# Patient Record
Sex: Female | Born: 1935 | Race: White | Hispanic: No | Smoking: Former smoker
Health system: Southern US, Community
[De-identification: ages and names within clinical notes are randomized; demographics above are authoritative.]

## PROBLEM LIST (undated history)

## (undated) DIAGNOSIS — I739 Peripheral vascular disease, unspecified: Secondary | ICD-10-CM

## (undated) DIAGNOSIS — I4891 Unspecified atrial fibrillation: Secondary | ICD-10-CM

## (undated) DIAGNOSIS — Z860100 Personal history of colon polyps, unspecified: Secondary | ICD-10-CM

## (undated) DIAGNOSIS — I10 Essential (primary) hypertension: Secondary | ICD-10-CM

## (undated) DIAGNOSIS — N289 Disorder of kidney and ureter, unspecified: Secondary | ICD-10-CM

## (undated) DIAGNOSIS — F419 Anxiety disorder, unspecified: Secondary | ICD-10-CM

## (undated) DIAGNOSIS — K219 Gastro-esophageal reflux disease without esophagitis: Secondary | ICD-10-CM

## (undated) DIAGNOSIS — R569 Unspecified convulsions: Secondary | ICD-10-CM

## (undated) DIAGNOSIS — I509 Heart failure, unspecified: Secondary | ICD-10-CM

## (undated) DIAGNOSIS — J449 Chronic obstructive pulmonary disease, unspecified: Secondary | ICD-10-CM

## (undated) HISTORY — DX: Peripheral vascular disease, unspecified: I73.9

## (undated) HISTORY — DX: Personal history of colonic polyps: Z86.010

## (undated) HISTORY — DX: Unspecified atrial fibrillation: I48.91

## (undated) HISTORY — PX: CATARACT EXTRACTION: SUR2

## (undated) HISTORY — PX: ABDOMINAL HYSTERECTOMY: SHX81

## (undated) HISTORY — DX: Disorder of kidney and ureter, unspecified: N28.9

## (undated) HISTORY — PX: APPENDECTOMY: SHX54

## (undated) HISTORY — DX: Unspecified convulsions: R56.9

## (undated) HISTORY — DX: Essential (primary) hypertension: I10

## (undated) HISTORY — PX: RETINAL DETACHMENT SURGERY: SHX105

## (undated) HISTORY — PX: LUMBAR DISC SURGERY: SHX700

## (undated) HISTORY — DX: Heart failure, unspecified: I50.9

## (undated) HISTORY — DX: Personal history of colon polyps, unspecified: Z86.0100

## (undated) HISTORY — DX: Chronic obstructive pulmonary disease, unspecified: J44.9

## (undated) HISTORY — DX: Anxiety disorder, unspecified: F41.9

## (undated) HISTORY — DX: Gastro-esophageal reflux disease without esophagitis: K21.9

---

## 2002-03-16 DIAGNOSIS — D126 Benign neoplasm of colon, unspecified: Secondary | ICD-10-CM | POA: Insufficient documentation

## 2003-06-07 ENCOUNTER — Other Ambulatory Visit: Payer: Self-pay

## 2004-05-14 LAB — HM MAMMOGRAPHY

## 2005-01-11 ENCOUNTER — Ambulatory Visit: Payer: Self-pay | Admitting: Endocrinology

## 2005-04-20 ENCOUNTER — Inpatient Hospital Stay: Payer: Self-pay | Admitting: Endocrinology

## 2005-04-20 ENCOUNTER — Other Ambulatory Visit: Payer: Self-pay

## 2005-04-24 ENCOUNTER — Other Ambulatory Visit: Payer: Self-pay

## 2005-04-27 ENCOUNTER — Other Ambulatory Visit: Payer: Self-pay

## 2005-05-02 ENCOUNTER — Inpatient Hospital Stay: Payer: Self-pay | Admitting: Endocrinology

## 2005-05-02 ENCOUNTER — Other Ambulatory Visit: Payer: Self-pay

## 2005-05-03 ENCOUNTER — Other Ambulatory Visit: Payer: Self-pay

## 2005-05-07 ENCOUNTER — Other Ambulatory Visit: Payer: Self-pay

## 2005-09-13 ENCOUNTER — Ambulatory Visit: Payer: Self-pay | Admitting: Family Medicine

## 2005-09-20 ENCOUNTER — Ambulatory Visit: Payer: Self-pay | Admitting: Internal Medicine

## 2005-09-29 ENCOUNTER — Ambulatory Visit: Payer: Self-pay | Admitting: Family Medicine

## 2005-10-13 ENCOUNTER — Ambulatory Visit: Payer: Self-pay | Admitting: Internal Medicine

## 2005-10-19 ENCOUNTER — Ambulatory Visit: Payer: Self-pay | Admitting: Internal Medicine

## 2005-10-27 ENCOUNTER — Ambulatory Visit: Payer: Self-pay | Admitting: Family Medicine

## 2005-11-09 ENCOUNTER — Ambulatory Visit: Payer: Self-pay | Admitting: Internal Medicine

## 2005-11-23 ENCOUNTER — Ambulatory Visit: Payer: Self-pay | Admitting: Internal Medicine

## 2005-12-13 ENCOUNTER — Ambulatory Visit: Payer: Self-pay | Admitting: Family Medicine

## 2005-12-21 ENCOUNTER — Ambulatory Visit: Payer: Self-pay | Admitting: Internal Medicine

## 2006-01-19 ENCOUNTER — Ambulatory Visit: Payer: Self-pay | Admitting: Family Medicine

## 2006-01-26 ENCOUNTER — Ambulatory Visit: Payer: Self-pay | Admitting: Internal Medicine

## 2006-02-09 ENCOUNTER — Ambulatory Visit: Payer: Self-pay | Admitting: Internal Medicine

## 2006-02-12 ENCOUNTER — Ambulatory Visit: Payer: Self-pay | Admitting: Family Medicine

## 2006-02-19 ENCOUNTER — Ambulatory Visit: Payer: Self-pay | Admitting: Family Medicine

## 2006-03-09 ENCOUNTER — Ambulatory Visit: Payer: Self-pay | Admitting: Internal Medicine

## 2006-03-19 ENCOUNTER — Ambulatory Visit: Payer: Self-pay | Admitting: Internal Medicine

## 2006-03-19 LAB — CONVERTED CEMR LAB
Albumin: 3.8 g/dL (ref 3.5–5.2)
BUN: 28 mg/dL — ABNORMAL HIGH (ref 6–23)
CO2: 33 meq/L — ABNORMAL HIGH (ref 19–32)
Calcium: 9.4 mg/dL (ref 8.4–10.5)
Chloride: 104 meq/L (ref 96–112)
Cholesterol: 157 mg/dL (ref 0–200)
Creatinine, Ser: 1.2 mg/dL (ref 0.4–1.2)
GFR calc Af Amer: 55 mL/min
GFR calc non Af Amer: 46 mL/min
Glucose, Bld: 112 mg/dL — ABNORMAL HIGH (ref 70–99)
HDL: 52.6 mg/dL (ref >39.0)
LDL Cholesterol: 77 mg/dL (ref 0–99)
Phosphorus: 3.8 mg/dL (ref 2.3–4.6)
Potassium: 4.2 meq/L (ref 3.5–5.1)
Sodium: 143 meq/L (ref 135–145)
Total CHOL/HDL Ratio: 3
Triglycerides: 137 mg/dL (ref 0–149)
VLDL: 27 mg/dL (ref 0–40)

## 2006-04-10 ENCOUNTER — Ambulatory Visit: Payer: Self-pay | Admitting: Internal Medicine

## 2006-04-16 ENCOUNTER — Ambulatory Visit: Payer: Self-pay | Admitting: Internal Medicine

## 2006-04-24 ENCOUNTER — Ambulatory Visit: Payer: Self-pay | Admitting: *Deleted

## 2006-05-01 ENCOUNTER — Encounter: Payer: Self-pay | Admitting: Internal Medicine

## 2006-05-11 ENCOUNTER — Encounter: Payer: Self-pay | Admitting: Family Medicine

## 2006-05-11 DIAGNOSIS — I359 Nonrheumatic aortic valve disorder, unspecified: Secondary | ICD-10-CM | POA: Insufficient documentation

## 2006-05-11 DIAGNOSIS — J4489 Other specified chronic obstructive pulmonary disease: Secondary | ICD-10-CM | POA: Insufficient documentation

## 2006-05-11 DIAGNOSIS — N259 Disorder resulting from impaired renal tubular function, unspecified: Secondary | ICD-10-CM | POA: Insufficient documentation

## 2006-05-11 DIAGNOSIS — J449 Chronic obstructive pulmonary disease, unspecified: Secondary | ICD-10-CM

## 2006-05-11 DIAGNOSIS — F17200 Nicotine dependence, unspecified, uncomplicated: Secondary | ICD-10-CM | POA: Insufficient documentation

## 2006-05-11 DIAGNOSIS — E7800 Pure hypercholesterolemia, unspecified: Secondary | ICD-10-CM

## 2006-05-11 DIAGNOSIS — F411 Generalized anxiety disorder: Secondary | ICD-10-CM | POA: Insufficient documentation

## 2006-05-11 DIAGNOSIS — M810 Age-related osteoporosis without current pathological fracture: Secondary | ICD-10-CM | POA: Insufficient documentation

## 2006-05-11 DIAGNOSIS — I4891 Unspecified atrial fibrillation: Secondary | ICD-10-CM | POA: Insufficient documentation

## 2006-05-11 DIAGNOSIS — D638 Anemia in other chronic diseases classified elsewhere: Secondary | ICD-10-CM

## 2006-05-14 ENCOUNTER — Ambulatory Visit: Payer: Self-pay | Admitting: Internal Medicine

## 2006-05-21 ENCOUNTER — Ambulatory Visit: Payer: Self-pay | Admitting: Internal Medicine

## 2006-05-23 ENCOUNTER — Ambulatory Visit: Payer: Self-pay | Admitting: Internal Medicine

## 2006-05-24 ENCOUNTER — Encounter: Payer: Self-pay | Admitting: Vascular Surgery

## 2006-06-04 ENCOUNTER — Ambulatory Visit: Payer: Self-pay | Admitting: Internal Medicine

## 2006-06-14 ENCOUNTER — Encounter: Payer: Self-pay | Admitting: Vascular Surgery

## 2006-06-14 ENCOUNTER — Ambulatory Visit: Payer: Self-pay | Admitting: Internal Medicine

## 2006-06-15 ENCOUNTER — Encounter: Payer: Self-pay | Admitting: Internal Medicine

## 2006-07-02 ENCOUNTER — Ambulatory Visit: Payer: Self-pay | Admitting: Internal Medicine

## 2006-07-02 LAB — CONVERTED CEMR LAB
INR: 3.6
Prothrombin Time: 23 s

## 2006-07-11 ENCOUNTER — Ambulatory Visit: Payer: Self-pay | Admitting: Internal Medicine

## 2006-07-15 ENCOUNTER — Encounter: Payer: Self-pay | Admitting: Vascular Surgery

## 2006-07-16 ENCOUNTER — Ambulatory Visit: Payer: Self-pay | Admitting: Internal Medicine

## 2006-07-16 LAB — CONVERTED CEMR LAB
INR: 2.3
Prothrombin Time: 18.5 s

## 2006-07-31 ENCOUNTER — Ambulatory Visit: Payer: Self-pay | Admitting: Internal Medicine

## 2006-07-31 LAB — CONVERTED CEMR LAB
INR: 2.1
Prothrombin Time: 17.7 s

## 2006-08-07 ENCOUNTER — Ambulatory Visit: Payer: Self-pay | Admitting: Internal Medicine

## 2006-08-07 LAB — CONVERTED CEMR LAB
ALT: 17 units/L (ref 0–40)
Albumin: 4 g/dL (ref 3.5–5.2)
BUN: 19 mg/dL (ref 6–23)
CO2: 33 meq/L — ABNORMAL HIGH (ref 19–32)
Calcium: 9.7 mg/dL (ref 8.4–10.5)
Chloride: 95 meq/L — ABNORMAL LOW (ref 96–112)
Cholesterol: 190 mg/dL (ref 0–200)
Creatinine, Ser: 1.1 mg/dL (ref 0.4–1.2)
GFR calc Af Amer: 61 mL/min
GFR calc non Af Amer: 50 mL/min
Glucose, Bld: 126 mg/dL — ABNORMAL HIGH (ref 70–99)
HDL: 64.2 mg/dL (ref >39.0)
LDL Cholesterol: 105 mg/dL — ABNORMAL HIGH (ref 0–99)
Phosphorus: 3.1 mg/dL (ref 2.3–4.6)
Potassium: 4 meq/L (ref 3.5–5.1)
Sodium: 142 meq/L (ref 135–145)
Total CHOL/HDL Ratio: 3
Triglycerides: 103 mg/dL (ref 0–149)
VLDL: 21 mg/dL (ref 0–40)

## 2006-08-13 ENCOUNTER — Ambulatory Visit: Payer: Self-pay | Admitting: Internal Medicine

## 2006-08-14 ENCOUNTER — Encounter: Payer: Self-pay | Admitting: Internal Medicine

## 2006-08-14 ENCOUNTER — Encounter: Payer: Self-pay | Admitting: Vascular Surgery

## 2006-08-28 ENCOUNTER — Ambulatory Visit: Payer: Self-pay | Admitting: Internal Medicine

## 2006-08-28 LAB — CONVERTED CEMR LAB
INR: 2.2
Prothrombin Time: 18.2 s

## 2006-09-06 ENCOUNTER — Ambulatory Visit: Payer: Self-pay | Admitting: Family Medicine

## 2006-09-14 ENCOUNTER — Encounter: Payer: Self-pay | Admitting: Vascular Surgery

## 2006-09-25 ENCOUNTER — Ambulatory Visit: Payer: Self-pay | Admitting: Internal Medicine

## 2006-09-25 LAB — CONVERTED CEMR LAB
INR: 1.6
Prothrombin Time: 15.7 s

## 2006-09-26 ENCOUNTER — Ambulatory Visit: Payer: Self-pay | Admitting: Internal Medicine

## 2006-09-26 DIAGNOSIS — I739 Peripheral vascular disease, unspecified: Secondary | ICD-10-CM

## 2006-09-26 DIAGNOSIS — I10 Essential (primary) hypertension: Secondary | ICD-10-CM | POA: Insufficient documentation

## 2006-09-26 HISTORY — DX: Peripheral vascular disease, unspecified: I73.9

## 2006-10-09 ENCOUNTER — Ambulatory Visit: Payer: Self-pay | Admitting: Internal Medicine

## 2006-10-09 LAB — CONVERTED CEMR LAB
INR: 2.6
Prothrombin Time: 19.7 s

## 2006-11-01 ENCOUNTER — Ambulatory Visit: Payer: Self-pay | Admitting: Internal Medicine

## 2006-11-06 ENCOUNTER — Ambulatory Visit: Payer: Self-pay | Admitting: Internal Medicine

## 2006-11-06 LAB — CONVERTED CEMR LAB: INR: 3.4

## 2006-11-20 ENCOUNTER — Ambulatory Visit: Payer: Self-pay | Admitting: Internal Medicine

## 2006-11-20 LAB — CONVERTED CEMR LAB
INR: 2
Prothrombin Time: 17.5 s

## 2006-12-18 ENCOUNTER — Ambulatory Visit: Payer: Self-pay | Admitting: Internal Medicine

## 2006-12-18 LAB — CONVERTED CEMR LAB: Prothrombin Time: 15.5 s

## 2006-12-21 ENCOUNTER — Telehealth (INDEPENDENT_AMBULATORY_CARE_PROVIDER_SITE_OTHER): Payer: Self-pay | Admitting: *Deleted

## 2006-12-26 ENCOUNTER — Ambulatory Visit: Payer: Self-pay | Admitting: Internal Medicine

## 2007-01-01 ENCOUNTER — Ambulatory Visit: Payer: Self-pay | Admitting: Internal Medicine

## 2007-01-03 LAB — CONVERTED CEMR LAB
Albumin: 3.9 g/dL (ref 3.5–5.2)
Basophils Absolute: 0 10*3/uL (ref 0.0–0.1)
Chloride: 92 meq/L — ABNORMAL LOW (ref 96–112)
Eosinophils Absolute: 0.1 10*3/uL (ref 0.0–0.6)
GFR calc Af Amer: 68 mL/min
GFR calc non Af Amer: 56 mL/min
Glucose, Bld: 93 mg/dL (ref 70–99)
Hemoglobin: 13.8 g/dL (ref 12.0–15.0)
MCHC: 34.2 g/dL (ref 30.0–36.0)
MCV: 83 fL (ref 78.0–100.0)
Monocytes Absolute: 0.6 10*3/uL (ref 0.2–0.7)
Monocytes Relative: 8.8 % (ref 3.0–11.0)
Neutro Abs: 3.5 10*3/uL (ref 1.4–7.7)
Neutrophils Relative %: 55.2 % (ref 43.0–77.0)
Phosphorus: 2.8 mg/dL (ref 2.3–4.6)
Potassium: 4.1 meq/L (ref 3.5–5.1)
RDW: 15.6 % — ABNORMAL HIGH (ref 11.5–14.6)
Sodium: 139 meq/L (ref 135–145)
Total CHOL/HDL Ratio: 5.4

## 2007-01-29 ENCOUNTER — Ambulatory Visit: Payer: Self-pay | Admitting: Internal Medicine

## 2007-02-01 ENCOUNTER — Ambulatory Visit: Payer: Self-pay | Admitting: Internal Medicine

## 2007-02-04 ENCOUNTER — Ambulatory Visit: Payer: Self-pay | Admitting: Internal Medicine

## 2007-02-05 ENCOUNTER — Telehealth (INDEPENDENT_AMBULATORY_CARE_PROVIDER_SITE_OTHER): Payer: Self-pay | Admitting: *Deleted

## 2007-02-05 ENCOUNTER — Ambulatory Visit: Payer: Self-pay | Admitting: Internal Medicine

## 2007-02-06 ENCOUNTER — Ambulatory Visit: Payer: Self-pay | Admitting: Internal Medicine

## 2007-02-12 ENCOUNTER — Ambulatory Visit: Payer: Self-pay | Admitting: Internal Medicine

## 2007-02-12 LAB — CONVERTED CEMR LAB
INR: 3
Prothrombin Time: 21 s

## 2007-02-20 ENCOUNTER — Ambulatory Visit: Payer: Self-pay | Admitting: Internal Medicine

## 2007-02-26 ENCOUNTER — Ambulatory Visit: Payer: Self-pay | Admitting: Internal Medicine

## 2007-02-26 LAB — CONVERTED CEMR LAB
INR: 2.3 — ABNORMAL HIGH (ref 0.8–1.0)
Prothrombin Time: 18.9 s — ABNORMAL HIGH (ref 10.9–13.3)

## 2007-02-27 ENCOUNTER — Telehealth (INDEPENDENT_AMBULATORY_CARE_PROVIDER_SITE_OTHER): Payer: Self-pay | Admitting: *Deleted

## 2007-03-04 ENCOUNTER — Telehealth (INDEPENDENT_AMBULATORY_CARE_PROVIDER_SITE_OTHER): Payer: Self-pay | Admitting: *Deleted

## 2007-03-22 ENCOUNTER — Ambulatory Visit: Payer: Self-pay | Admitting: Internal Medicine

## 2007-03-22 LAB — CONVERTED CEMR LAB
INR: 2.2
Prothrombin Time: 18.1 s

## 2007-03-25 DIAGNOSIS — J984 Other disorders of lung: Secondary | ICD-10-CM

## 2007-03-29 ENCOUNTER — Ambulatory Visit: Payer: Self-pay | Admitting: Internal Medicine

## 2007-04-16 ENCOUNTER — Ambulatory Visit: Payer: Self-pay | Admitting: Internal Medicine

## 2007-04-16 LAB — CONVERTED CEMR LAB
INR: 2.7
Prothrombin Time: 19.9 s

## 2007-05-03 ENCOUNTER — Telehealth (INDEPENDENT_AMBULATORY_CARE_PROVIDER_SITE_OTHER): Payer: Self-pay | Admitting: *Deleted

## 2007-05-03 ENCOUNTER — Ambulatory Visit: Payer: Self-pay | Admitting: Internal Medicine

## 2007-05-14 ENCOUNTER — Ambulatory Visit: Payer: Self-pay | Admitting: Internal Medicine

## 2007-05-28 ENCOUNTER — Telehealth: Payer: Self-pay | Admitting: Family Medicine

## 2007-05-28 ENCOUNTER — Ambulatory Visit: Payer: Self-pay | Admitting: Internal Medicine

## 2007-05-28 LAB — CONVERTED CEMR LAB
INR: 2.4
Prothrombin Time: 19 s

## 2007-06-26 ENCOUNTER — Ambulatory Visit: Payer: Self-pay | Admitting: Internal Medicine

## 2007-06-26 LAB — CONVERTED CEMR LAB
INR: 2.3
Prothrombin Time: 18.4 s

## 2007-06-27 ENCOUNTER — Telehealth: Payer: Self-pay | Admitting: Internal Medicine

## 2007-07-12 ENCOUNTER — Telehealth (INDEPENDENT_AMBULATORY_CARE_PROVIDER_SITE_OTHER): Payer: Self-pay | Admitting: *Deleted

## 2007-07-24 ENCOUNTER — Ambulatory Visit: Payer: Self-pay | Admitting: Internal Medicine

## 2007-07-24 LAB — CONVERTED CEMR LAB

## 2007-08-06 ENCOUNTER — Ambulatory Visit: Payer: Self-pay | Admitting: Internal Medicine

## 2007-08-07 LAB — CONVERTED CEMR LAB
Albumin: 3.9 g/dL (ref 3.5–5.2)
BUN: 21 mg/dL (ref 6–23)
Calcium: 10 mg/dL (ref 8.4–10.5)
Eosinophils Absolute: 0.1 10*3/uL (ref 0.0–0.7)
Eosinophils Relative: 1.4 % (ref 0.0–5.0)
Glucose, Bld: 192 mg/dL — ABNORMAL HIGH (ref 70–99)
HCT: 39 % (ref 36.0–46.0)
Hemoglobin: 13.4 g/dL (ref 12.0–15.0)
MCV: 82.2 fL (ref 78.0–100.0)
Monocytes Absolute: 0.7 10*3/uL (ref 0.1–1.0)
Monocytes Relative: 8.8 % (ref 3.0–12.0)
Neutro Abs: 4.3 10*3/uL (ref 1.4–7.7)
Phosphorus: 4 mg/dL (ref 2.3–4.6)
Platelets: 190 10*3/uL (ref 150–400)
Potassium: 3.6 meq/L (ref 3.5–5.1)
RDW: 14.9 % — ABNORMAL HIGH (ref 11.5–14.6)

## 2007-08-12 ENCOUNTER — Ambulatory Visit: Payer: Self-pay | Admitting: Family Medicine

## 2007-08-13 ENCOUNTER — Encounter: Payer: Self-pay | Admitting: Internal Medicine

## 2007-08-20 ENCOUNTER — Telehealth: Payer: Self-pay | Admitting: Internal Medicine

## 2007-08-22 ENCOUNTER — Ambulatory Visit: Payer: Self-pay | Admitting: Internal Medicine

## 2007-08-22 LAB — CONVERTED CEMR LAB
Glucose, Bld: 120 mg/dL — ABNORMAL HIGH (ref 70–99)
Hgb A1c MFr Bld: 6.3 % — ABNORMAL HIGH (ref 4.6–6.0)
INR: 2
Prothrombin Time: 17.3 s

## 2007-09-19 ENCOUNTER — Telehealth (INDEPENDENT_AMBULATORY_CARE_PROVIDER_SITE_OTHER): Payer: Self-pay | Admitting: *Deleted

## 2007-09-19 ENCOUNTER — Ambulatory Visit: Payer: Self-pay | Admitting: Internal Medicine

## 2007-09-19 LAB — CONVERTED CEMR LAB
INR: 2.2
Prothrombin Time: 18 s

## 2007-10-17 ENCOUNTER — Ambulatory Visit: Payer: Self-pay | Admitting: Internal Medicine

## 2007-10-17 LAB — CONVERTED CEMR LAB: Prothrombin Time: 21.5 s

## 2007-10-31 ENCOUNTER — Ambulatory Visit: Payer: Self-pay | Admitting: Internal Medicine

## 2007-11-14 ENCOUNTER — Ambulatory Visit: Payer: Self-pay | Admitting: Internal Medicine

## 2007-11-20 ENCOUNTER — Telehealth: Payer: Self-pay | Admitting: Internal Medicine

## 2007-12-03 ENCOUNTER — Ambulatory Visit: Payer: Self-pay | Admitting: Internal Medicine

## 2007-12-03 LAB — CONVERTED CEMR LAB
Albumin: 3.8 g/dL (ref 3.5–5.2)
BUN: 21 mg/dL (ref 6–23)
Basophils Relative: 0.7 % (ref 0.0–3.0)
Chloride: 95 meq/L — ABNORMAL LOW (ref 96–112)
Creatinine, Ser: 1 mg/dL (ref 0.4–1.2)
Eosinophils Absolute: 0.1 10*3/uL (ref 0.0–0.7)
Eosinophils Relative: 1.3 % (ref 0.0–5.0)
HCT: 39.5 % (ref 36.0–46.0)
Hemoglobin: 13.3 g/dL (ref 12.0–15.0)
MCHC: 33.2 g/dL (ref 30.0–36.0)
MCV: 83.1 fL (ref 78.0–100.0)
Monocytes Absolute: 0.6 10*3/uL (ref 0.1–1.0)
Neutro Abs: 4.5 10*3/uL (ref 1.4–7.7)
Neutrophils Relative %: 62.8 % (ref 43.0–77.0)
Phosphorus: 2.3 mg/dL (ref 2.3–4.6)
RBC: 4.66 M/uL (ref 3.87–5.11)
WBC: 7.2 10*3/uL (ref 4.5–10.5)

## 2007-12-05 LAB — CONVERTED CEMR LAB: Prothrombin Time: 26.8 s — ABNORMAL HIGH (ref 10.9–13.3)

## 2008-01-03 ENCOUNTER — Ambulatory Visit: Payer: Self-pay | Admitting: Internal Medicine

## 2008-01-03 LAB — CONVERTED CEMR LAB: INR: 1.8

## 2008-01-13 ENCOUNTER — Ambulatory Visit: Payer: Self-pay | Admitting: Family Medicine

## 2008-01-20 ENCOUNTER — Ambulatory Visit: Payer: Self-pay | Admitting: Internal Medicine

## 2008-01-21 LAB — CONVERTED CEMR LAB
INR: 4.7 — ABNORMAL HIGH (ref 0.8–1.0)
Prothrombin Time: 46.6 s — ABNORMAL HIGH (ref 10.9–13.3)

## 2008-01-23 ENCOUNTER — Ambulatory Visit: Payer: Self-pay | Admitting: Family Medicine

## 2008-01-23 ENCOUNTER — Ambulatory Visit: Payer: Self-pay | Admitting: Internal Medicine

## 2008-01-23 DIAGNOSIS — K5900 Constipation, unspecified: Secondary | ICD-10-CM | POA: Insufficient documentation

## 2008-01-23 LAB — CONVERTED CEMR LAB: INR: 1

## 2008-01-30 ENCOUNTER — Ambulatory Visit: Payer: Self-pay | Admitting: Internal Medicine

## 2008-02-13 ENCOUNTER — Ambulatory Visit: Payer: Self-pay | Admitting: Internal Medicine

## 2008-02-13 LAB — CONVERTED CEMR LAB: INR: 2.3

## 2008-02-19 ENCOUNTER — Telehealth: Payer: Self-pay | Admitting: Family Medicine

## 2008-03-12 ENCOUNTER — Ambulatory Visit: Payer: Self-pay | Admitting: Internal Medicine

## 2008-03-12 LAB — CONVERTED CEMR LAB: Prothrombin Time: 17.3 s

## 2008-03-19 ENCOUNTER — Telehealth: Payer: Self-pay | Admitting: Internal Medicine

## 2008-04-02 ENCOUNTER — Ambulatory Visit: Payer: Self-pay | Admitting: Internal Medicine

## 2008-04-06 LAB — CONVERTED CEMR LAB
ALT: 18 units/L (ref 0–35)
AST: 29 units/L (ref 0–37)
Albumin: 4.1 g/dL (ref 3.5–5.2)
Alkaline Phosphatase: 57 units/L (ref 39–117)
Basophils Relative: 0.2 % (ref 0.0–3.0)
Calcium: 10.7 mg/dL — ABNORMAL HIGH (ref 8.4–10.5)
Creatinine, Ser: 1.1 mg/dL (ref 0.4–1.2)
Eosinophils Relative: 2.4 % (ref 0.0–5.0)
GFR calc Af Amer: 61 mL/min
GFR calc non Af Amer: 50 mL/min
Lymphocytes Relative: 36.3 % (ref 12.0–46.0)
Neutrophils Relative %: 53.6 % (ref 43.0–77.0)
RBC: 4.95 M/uL (ref 3.87–5.11)
Total Protein: 6.9 g/dL (ref 6.0–8.3)
WBC: 7.9 10*3/uL (ref 4.5–10.5)

## 2008-04-30 ENCOUNTER — Ambulatory Visit: Payer: Self-pay | Admitting: Internal Medicine

## 2008-05-25 ENCOUNTER — Telehealth: Payer: Self-pay | Admitting: Internal Medicine

## 2008-05-28 ENCOUNTER — Ambulatory Visit: Payer: Self-pay | Admitting: Internal Medicine

## 2008-05-28 LAB — CONVERTED CEMR LAB: Prothrombin Time: 16.4 s

## 2008-06-25 ENCOUNTER — Ambulatory Visit: Payer: Self-pay | Admitting: Internal Medicine

## 2008-06-25 LAB — CONVERTED CEMR LAB: INR: 2

## 2008-07-15 ENCOUNTER — Telehealth: Payer: Self-pay | Admitting: Internal Medicine

## 2008-07-23 ENCOUNTER — Ambulatory Visit: Payer: Self-pay | Admitting: Internal Medicine

## 2008-07-23 LAB — CONVERTED CEMR LAB: Prothrombin Time: 16.1 s

## 2008-08-03 ENCOUNTER — Ambulatory Visit: Payer: Self-pay | Admitting: Internal Medicine

## 2008-08-03 DIAGNOSIS — I5032 Chronic diastolic (congestive) heart failure: Secondary | ICD-10-CM

## 2008-08-03 LAB — CONVERTED CEMR LAB
INR: 1.6
Prothrombin Time: 15.8 s

## 2008-08-04 LAB — CONVERTED CEMR LAB
Albumin: 3.8 g/dL (ref 3.5–5.2)
BUN: 22 mg/dL (ref 6–23)
Basophils Absolute: 0 10*3/uL (ref 0.0–0.1)
Chloride: 97 meq/L (ref 96–112)
Eosinophils Absolute: 0.2 10*3/uL (ref 0.0–0.7)
HCT: 36.5 % (ref 36.0–46.0)
Lymphs Abs: 2.3 10*3/uL (ref 0.7–4.0)
MCHC: 33.7 g/dL (ref 30.0–36.0)
Monocytes Absolute: 0.7 10*3/uL (ref 0.1–1.0)
Monocytes Relative: 10.5 % (ref 3.0–12.0)
Phosphorus: 3.7 mg/dL (ref 2.3–4.6)
Platelets: 136 10*3/uL — ABNORMAL LOW (ref 150.0–400.0)
RDW: 14.4 % (ref 11.5–14.6)

## 2008-08-18 ENCOUNTER — Ambulatory Visit: Payer: Self-pay | Admitting: Internal Medicine

## 2008-08-18 LAB — CONVERTED CEMR LAB: Prothrombin Time: 17.6 s

## 2008-09-01 ENCOUNTER — Ambulatory Visit: Payer: Self-pay | Admitting: Internal Medicine

## 2008-09-01 LAB — CONVERTED CEMR LAB: INR: 1.9

## 2008-09-29 ENCOUNTER — Ambulatory Visit: Payer: Self-pay | Admitting: Internal Medicine

## 2008-10-20 ENCOUNTER — Telehealth: Payer: Self-pay | Admitting: Internal Medicine

## 2008-10-20 ENCOUNTER — Ambulatory Visit: Payer: Self-pay | Admitting: Internal Medicine

## 2008-10-20 LAB — CONVERTED CEMR LAB: INR: 2.4

## 2008-11-04 ENCOUNTER — Ambulatory Visit: Payer: Self-pay | Admitting: Internal Medicine

## 2008-11-17 ENCOUNTER — Ambulatory Visit: Payer: Self-pay | Admitting: Internal Medicine

## 2008-11-17 LAB — CONVERTED CEMR LAB
INR: 3.4
Prothrombin Time: 22.2 s

## 2008-12-15 ENCOUNTER — Ambulatory Visit: Payer: Self-pay | Admitting: Internal Medicine

## 2008-12-15 LAB — CONVERTED CEMR LAB
INR: 2.7
Prothrombin Time: 19.8 s

## 2009-01-12 ENCOUNTER — Ambulatory Visit: Payer: Self-pay | Admitting: Internal Medicine

## 2009-01-19 ENCOUNTER — Telehealth: Payer: Self-pay | Admitting: Internal Medicine

## 2009-01-25 ENCOUNTER — Telehealth: Payer: Self-pay | Admitting: Internal Medicine

## 2009-02-10 ENCOUNTER — Ambulatory Visit: Payer: Self-pay | Admitting: Internal Medicine

## 2009-02-22 ENCOUNTER — Ambulatory Visit: Payer: Self-pay | Admitting: Internal Medicine

## 2009-02-22 LAB — CONVERTED CEMR LAB: Prothrombin Time: 17.5 s

## 2009-03-02 ENCOUNTER — Encounter: Payer: Self-pay | Admitting: Internal Medicine

## 2009-03-03 ENCOUNTER — Ambulatory Visit: Payer: Self-pay | Admitting: Internal Medicine

## 2009-03-05 LAB — CONVERTED CEMR LAB
ALT: 16 units/L (ref 0–35)
AST: 30 units/L (ref 0–37)
Albumin: 4 g/dL (ref 3.5–5.2)
Alkaline Phosphatase: 51 units/L (ref 39–117)
Basophils Relative: 0.2 % (ref 0.0–3.0)
Bilirubin, Direct: 0 mg/dL (ref 0.0–0.3)
Chloride: 99 meq/L (ref 96–112)
Eosinophils Absolute: 0.1 10*3/uL (ref 0.0–0.7)
Eosinophils Relative: 1.1 % (ref 0.0–5.0)
Glucose, Bld: 129 mg/dL — ABNORMAL HIGH (ref 70–99)
Hemoglobin: 12.4 g/dL (ref 12.0–15.0)
Lymphocytes Relative: 26.4 % (ref 12.0–46.0)
MCHC: 32.8 g/dL (ref 30.0–36.0)
Monocytes Relative: 10.6 % (ref 3.0–12.0)
Neutro Abs: 4.5 10*3/uL (ref 1.4–7.7)
Neutrophils Relative %: 61.7 % (ref 43.0–77.0)
Phosphorus: 3.6 mg/dL (ref 2.3–4.6)
Potassium: 4.9 meq/L (ref 3.5–5.1)
RBC: 4.56 M/uL (ref 3.87–5.11)
Sodium: 145 meq/L (ref 135–145)
TSH: 0.63 microintl units/mL (ref 0.35–5.50)
Total Protein: 6.9 g/dL (ref 6.0–8.3)
WBC: 7.4 10*3/uL (ref 4.5–10.5)

## 2009-03-08 ENCOUNTER — Ambulatory Visit: Payer: Self-pay | Admitting: Internal Medicine

## 2009-03-08 LAB — CONVERTED CEMR LAB: Prothrombin Time: 15.5 s

## 2009-03-22 ENCOUNTER — Ambulatory Visit: Payer: Self-pay | Admitting: Internal Medicine

## 2009-04-08 ENCOUNTER — Ambulatory Visit: Payer: Self-pay | Admitting: Internal Medicine

## 2009-04-08 LAB — CONVERTED CEMR LAB: INR: 2.7

## 2009-05-05 ENCOUNTER — Ambulatory Visit: Payer: Self-pay | Admitting: Internal Medicine

## 2009-05-05 LAB — CONVERTED CEMR LAB: INR: 2.2

## 2009-05-18 ENCOUNTER — Telehealth: Payer: Self-pay | Admitting: Internal Medicine

## 2009-05-31 ENCOUNTER — Ambulatory Visit: Payer: Self-pay | Admitting: Internal Medicine

## 2009-05-31 LAB — CONVERTED CEMR LAB
INR: 2.4
Prothrombin Time: 29 s

## 2009-06-28 ENCOUNTER — Ambulatory Visit: Payer: Self-pay | Admitting: Internal Medicine

## 2009-06-28 LAB — CONVERTED CEMR LAB
INR: 2
Prothrombin Time: 24.3 s

## 2009-07-27 ENCOUNTER — Ambulatory Visit: Payer: Self-pay | Admitting: Internal Medicine

## 2009-08-12 ENCOUNTER — Ambulatory Visit: Payer: Self-pay | Admitting: Internal Medicine

## 2009-08-12 LAB — CONVERTED CEMR LAB: Prothrombin Time: 27.3 s

## 2009-08-17 LAB — CONVERTED CEMR LAB
Albumin: 4.1 g/dL (ref 3.5–5.2)
BUN: 27 mg/dL — ABNORMAL HIGH (ref 6–23)
CO2: 37 meq/L — ABNORMAL HIGH (ref 19–32)
Chloride: 97 meq/L (ref 96–112)
GFR calc non Af Amer: 57.78 mL/min (ref >60)
Phosphorus: 3.4 mg/dL (ref 2.3–4.6)
Potassium: 4.3 meq/L (ref 3.5–5.1)

## 2009-09-07 ENCOUNTER — Ambulatory Visit: Payer: Self-pay | Admitting: Internal Medicine

## 2009-09-07 LAB — CONVERTED CEMR LAB: INR: 3.5

## 2009-09-21 ENCOUNTER — Ambulatory Visit: Payer: Self-pay | Admitting: Internal Medicine

## 2009-09-21 LAB — CONVERTED CEMR LAB
INR: 3.1
Prothrombin Time: 37.3 s

## 2009-10-14 ENCOUNTER — Ambulatory Visit: Payer: Self-pay | Admitting: Internal Medicine

## 2009-10-14 DIAGNOSIS — R609 Edema, unspecified: Secondary | ICD-10-CM | POA: Insufficient documentation

## 2009-10-26 ENCOUNTER — Ambulatory Visit: Payer: Self-pay | Admitting: Internal Medicine

## 2009-10-26 LAB — CONVERTED CEMR LAB: INR: 3.1

## 2009-11-16 ENCOUNTER — Ambulatory Visit: Payer: Self-pay | Admitting: Internal Medicine

## 2009-11-16 ENCOUNTER — Telehealth: Payer: Self-pay | Admitting: Internal Medicine

## 2009-11-16 DIAGNOSIS — L03119 Cellulitis of unspecified part of limb: Secondary | ICD-10-CM

## 2009-11-16 DIAGNOSIS — L02619 Cutaneous abscess of unspecified foot: Secondary | ICD-10-CM | POA: Insufficient documentation

## 2009-12-03 ENCOUNTER — Ambulatory Visit: Payer: Self-pay | Admitting: Internal Medicine

## 2009-12-14 ENCOUNTER — Ambulatory Visit: Payer: Self-pay | Admitting: Internal Medicine

## 2009-12-14 LAB — CONVERTED CEMR LAB: INR: 2.2

## 2009-12-23 ENCOUNTER — Ambulatory Visit: Payer: Self-pay | Admitting: Internal Medicine

## 2009-12-23 LAB — CONVERTED CEMR LAB: Prothrombin Time: 33.2 s

## 2010-01-20 ENCOUNTER — Ambulatory Visit: Payer: Self-pay | Admitting: Internal Medicine

## 2010-01-20 LAB — CONVERTED CEMR LAB
INR: 3.7
Prothrombin Time: 44.9 s

## 2010-02-01 ENCOUNTER — Inpatient Hospital Stay: Payer: Self-pay | Admitting: Internal Medicine

## 2010-02-01 ENCOUNTER — Telehealth: Payer: Self-pay | Admitting: Internal Medicine

## 2010-02-03 ENCOUNTER — Ambulatory Visit: Payer: Self-pay | Admitting: Internal Medicine

## 2010-02-04 ENCOUNTER — Encounter: Payer: Self-pay | Admitting: Internal Medicine

## 2010-02-08 ENCOUNTER — Inpatient Hospital Stay: Payer: Self-pay | Admitting: *Deleted

## 2010-02-08 ENCOUNTER — Telehealth: Payer: Self-pay | Admitting: Internal Medicine

## 2010-02-09 ENCOUNTER — Encounter: Payer: Self-pay | Admitting: Internal Medicine

## 2010-02-12 ENCOUNTER — Ambulatory Visit: Payer: Self-pay | Admitting: Family Medicine

## 2010-02-22 ENCOUNTER — Encounter: Payer: Self-pay | Admitting: Internal Medicine

## 2010-03-01 ENCOUNTER — Ambulatory Visit
Admission: RE | Admit: 2010-03-01 | Discharge: 2010-03-01 | Payer: Self-pay | Source: Home / Self Care | Attending: Internal Medicine | Admitting: Internal Medicine

## 2010-03-02 ENCOUNTER — Other Ambulatory Visit: Payer: Self-pay | Admitting: Internal Medicine

## 2010-03-02 ENCOUNTER — Ambulatory Visit
Admission: RE | Admit: 2010-03-02 | Discharge: 2010-03-02 | Payer: Self-pay | Source: Home / Self Care | Attending: Internal Medicine | Admitting: Internal Medicine

## 2010-03-02 DIAGNOSIS — G40309 Generalized idiopathic epilepsy and epileptic syndromes, not intractable, without status epilepticus: Secondary | ICD-10-CM | POA: Insufficient documentation

## 2010-03-02 LAB — CBC WITH DIFFERENTIAL/PLATELET
Basophils Absolute: 0 10*3/uL (ref 0.0–0.1)
Basophils Relative: 0.5 % (ref 0.0–3.0)
Eosinophils Absolute: 0.3 10*3/uL (ref 0.0–0.7)
Eosinophils Relative: 4.9 % (ref 0.0–5.0)
HCT: 30.5 % — ABNORMAL LOW (ref 36.0–46.0)
Hemoglobin: 10.1 g/dL — ABNORMAL LOW (ref 12.0–15.0)
Lymphocytes Relative: 31.5 % (ref 12.0–46.0)
Lymphs Abs: 1.7 10*3/uL (ref 0.7–4.0)
MCHC: 33.2 g/dL (ref 30.0–36.0)
MCV: 79.2 fl (ref 78.0–100.0)
Monocytes Absolute: 0.5 10*3/uL (ref 0.1–1.0)
Monocytes Relative: 10 % (ref 3.0–12.0)
Neutro Abs: 2.9 10*3/uL (ref 1.4–7.7)
Neutrophils Relative %: 53.1 % (ref 43.0–77.0)
Platelets: 182 10*3/uL (ref 150.0–400.0)
RBC: 3.85 Mil/uL — ABNORMAL LOW (ref 3.87–5.11)
RDW: 18.1 % — ABNORMAL HIGH (ref 11.5–14.6)
WBC: 5.4 10*3/uL (ref 4.5–10.5)

## 2010-03-02 LAB — RENAL FUNCTION PANEL
Albumin: 3.4 g/dL — ABNORMAL LOW (ref 3.5–5.2)
BUN: 27 mg/dL — ABNORMAL HIGH (ref 6–23)
CO2: 33 mEq/L — ABNORMAL HIGH (ref 19–32)
Calcium: 9.2 mg/dL (ref 8.4–10.5)
Chloride: 96 mEq/L (ref 96–112)
Creatinine, Ser: 1 mg/dL (ref 0.4–1.2)
GFR: 53.24 mL/min — ABNORMAL LOW (ref >60.00)
Glucose, Bld: 109 mg/dL — ABNORMAL HIGH (ref 70–99)
Phosphorus: 2.9 mg/dL (ref 2.3–4.6)
Potassium: 3.9 mEq/L (ref 3.5–5.1)
Sodium: 137 mEq/L (ref 135–145)

## 2010-03-15 ENCOUNTER — Ambulatory Visit
Admission: RE | Admit: 2010-03-15 | Discharge: 2010-03-15 | Payer: Self-pay | Source: Home / Self Care | Attending: Internal Medicine | Admitting: Internal Medicine

## 2010-03-15 LAB — CONVERTED CEMR LAB
INR: 4.1
Prothrombin Time: 49.3 s

## 2010-03-15 NOTE — Assessment & Plan Note (Signed)
Summary: 4 MONTH FOLLOW UP/[facility]   Vital Signs:  Patient profile:   77 year old female Weight:      100 pounds O2 Sat:      88 % on 2 L/min Temp:     97.9 degrees F oral Pulse rate:   86 / minute Pulse rhythm:   regular Resp:     18 per minute BP sitting:   118 / 68  (left arm) Cuff size:   regular  Vitals Entered By: Mervin Hack CMA Duncan Dull) (March 03, 2009 10:34 AM)  O2 Flow:  2 L/min CC: 4 month follow-up   History of Present Illness: Here with daughter  Not feeling as well as last time Feels more weak Breathing is off some  Feels some depression Always "active" and now she is limited Has given up on vacuuming (though didn't do much before either) does do dishes and clothes--daughters do the rest   Eats freq small meals no sig change but has decreased some (not clearly in the past 6 months)  Slight chest pain--uses nitro rarely but it helps  Notes that she still has residual leg pain she relates to [facility]  Will be having nurse come to spend time 2 days per week (from [employer])  Allergies: 1)  Prednisone (Prednisone) 2)  * Clindamycin 3)  Codeine Phosphate (Codeine Phosphate) 4)  Tramadol Hcl (Tramadol Hcl)  Past History:  Past medical, surgical, family and social histories (including risk factors) reviewed for relevance to current acute and chronic problems.  Past Medical History: Reviewed history from 04/02/2008 and no changes required. COPD  ?liver damage secondary to cholesterol medicine CHF--diastolic Anxiety Atrial fibrillation Colonic polyps, hx of Hypertension Osteoporosis Peripheral vascular disease Renal insufficiency Constipation  Past Surgical History: Reviewed history from 03/22/2007 and no changes required. Hysterectomy-partial Appendectomy Lumbar disc repair Detached retina Cataract removal Echo--mod AS EF-51%   3/07 DEXA - Osteoporosis Hosp-- Pneumonia with bilat effusions 04/2005 Carotid doppler  05/03/05 Barium  enema (-) 05/03/05 Bilateral cataract surgery 10/2005 BXx - colitis-- 6/05  Family History: Reviewed history from 05/11/2006 and no changes required. Father: DIED 33 LUNG CA Mother: DIED 14 PANCREATIC CA Siblings: 10 , HIGH CHOL, CRA CV: GRANDPARENTS +MI DM: SON BREAST CA: SISTER LUNG CA: Marta Antu [facility] [phone]  Social History: Reviewed history from 02/01/2007 and no changes required.  Married X 62 years then widowed Children: 4 children--2 daughter and 1 son living, 1 son suicide at age 61 Retired--[employer] Hobbies--flowers, reading, geneology  and sewing Current Smoker  Review of Systems       no edema Not sleeping well--not sure why Intermittent abdominal pain Bad odor with stools----variable constipation and diarrhea No blood in stools  Physical Exam  General:  alert.  NAD Neck:  supple, no masses, and no thyromegaly.   Lungs:  no intercostal retractions, no accessory muscle use, no crackles, and no wheezes.   Decreased breath sounds but normal exp phase Heart:  normal rate, no murmur, no gallop, and irregular rhythm.   Abdomen:  soft.  Vague tenderness Extremities:  no edema Psych:  normally interactive, good eye contact, and dysphoric affect.     Impression & Recommendations:  Problem # 1:  CHRONIC DIASTOLIC HEART FAILURE (ICD-428.32) Assessment Deteriorated  status worsened will increase oxygen to 3l/min since she is still hypoxic on 2l/min decrease lasix--may be overdiuresed close follow up  Her updated medication list for this problem includes:    Furosemide 40 Mg Tabs (Furosemide) .Marland KitchenMarland KitchenMarland KitchenMarland Kitchen  1 tab daily    Warfarin Sodium 4 Mg Tabs (Warfarin sodium) .Marland Kitchen... Take 1 1/2 tabs daily as directed    Toprol Xl 25 Mg Tb24 (Metoprolol succinate) .Marland Kitchen... Take 1 tablet by mouth once a day  Orders: Venipuncture (16109) TLB-Renal Function Panel (80069-RENAL) TLB-CBC Platelet - w/Differential (85025-CBCD) TLB-Hepatic/Liver Function Pnl  (80076-HEPATIC) TLB-TSH (Thyroid Stimulating Hormone) (84443-TSH)  Problem # 2:  COPD (ICD-496) Assessment: Deteriorated O2 sat on 3l/min 94-96% seems to have slow but steady downward course  Her updated medication list for this problem includes:    Combivent 103-18 Mcg/act Aero (Albuterol-ipratropium) ..... Use 2 inhalations 4 times daily  Problem # 3:  ATRIAL FIBRILLATION (ICD-427.31) Assessment: Unchanged reasonable rate control no changes  Her updated medication list for this problem includes:    Warfarin Sodium 4 Mg Tabs (Warfarin sodium) .Marland Kitchen... Take 1 1/2 tabs daily as directed    Toprol Xl 25 Mg Tb24 (Metoprolol succinate) .Marland Kitchen... Take 1 tablet by mouth once a day  Problem # 4:  ANXIETY (ICD-300.00) Assessment: Unchanged needs to continue this as needed   Her updated medication list for this problem includes:    Alprazolam 0.5 Mg Tabs (Alprazolam) .Marland Kitchen... Take 1 tablet by mouth three times a day as needed for anxiety  Problem # 5:  PULMONARY NODULE (ICD-518.89) Assessment: Comment Only CXR needs review by radiologist Orders: CXR- 2view (CXR)  Complete Medication List: 1)  Alprazolam 0.5 Mg Tabs (Alprazolam) .... Take 1 tablet by mouth three times a day as needed for anxiety 2)  Miacalcin 200 Unit/act Soln (Calcitonin (salmon)) .... Spray 1 spray into one nostril once a day 3)  Combivent 103-18 Mcg/act Aero (Albuterol-ipratropium) .... Use 2 inhalations 4 times daily 4)  Furosemide 40 Mg Tabs (Furosemide) .Marland Kitchen.. 1 tab daily 5)  Warfarin Sodium 4 Mg Tabs (Warfarin sodium) .... Take 1 1/2 tabs daily as directed 6)  Toprol Xl 25 Mg Tb24 (Metoprolol succinate) .... Take 1 tablet by mouth once a day 7)  Nitroglycerin 0.4 Mg Subl (Nitroglycerin) .Marland Kitchen.. 1 under tongue as needed for chest pain. may repeat twice if needed 8)  Vitamin D 1000 Unit Tabs (Cholecalciferol) .Marland Kitchen.. 1 tab daily  Patient Instructions: 1)  Please schedule a follow-up appointment in 1 month.   Prescriptions: COMBIVENT 103-18 MCG/ACT  AERO (ALBUTEROL-IPRATROPIUM) Use 2 inhalations 4 times daily  #2 x 5   Entered and Authorized by:   Cindee Salt MD   Signed by:   Cindee Salt MD on 03/03/2009   Method used:   Electronically to        [facility]* (retail)       [address]       [place], [place]  [address]       Ph: [phone]       Fax: [phone]   RxID:   [id number]   Current Allergies (reviewed today): PREDNISONE (PREDNISONE) * CLINDAMYCIN CODEINE PHOSPHATE (CODEINE PHOSPHATE) TRAMADOL HCL (TRAMADOL HCL)

## 2010-03-15 NOTE — Assessment & Plan Note (Signed)
Summary: 4 M F/U DLO   Vital Signs:  Patient profile:   77 year old female Height:      62.25 inches Weight:      106.25 pounds BMI:     19.35 O2 Sat:      96 % on 2 L/min Temp:     97.5 degrees F oral Pulse rate:   80 / minute Pulse rhythm:   regular Resp:     20 per minute BP sitting:   128 / 80  (left arm) Cuff size:   regular  Vitals Entered By: Lewanda Rife LPN (August 12, 2009 2:22 PM)  O2 Flow:  2 L/min CC: four month f/u   History of Present Illness: Has been okay in general Notes intermittent DOE---gets winded just getting dressed.  occ worse, like today Gets winded with bath or walking short distances Has someone to clean house  Occ cooks but daughters mostly get her food or cook for he Eating pretty well  Occ notes heart "jump" Takes NTG then---seems to help Occ sharp pain Does get right foot swelling----morning and evening  Nerves have been okay uses the xanax two times a day to three times a day and it helps no persistent depression--gets down times    Allergies: 1)  Prednisone (Prednisone) 2)  * Clindamycin 3)  Codeine Phosphate (Codeine Phosphate) 4)  Tramadol Hcl (Tramadol Hcl)  Past History:  Past medical, surgical, family and social histories (including risk factors) reviewed for relevance to current acute and chronic problems.  Past Medical History: Reviewed history from 04/02/2008 and no changes required. COPD  ?liver damage secondary to cholesterol medicine CHF--diastolic Anxiety Atrial fibrillation Colonic polyps, hx of Hypertension Osteoporosis Peripheral vascular disease Renal insufficiency Constipation  Past Surgical History: Reviewed history from 03/22/2007 and no changes required. Hysterectomy-partial Appendectomy Lumbar disc repair Detached retina Cataract removal Echo--mod AS EF-51%   3/07 DEXA - Osteoporosis Hosp-- Pneumonia with bilat effusions 04/2005 Carotid doppler  05/03/05 Barium enema (-) 05/03/05 Bilateral  cataract surgery 10/2005 BXx - colitis-- 6/05  Family History: Reviewed history from 05/11/2006 and no changes required. Father: DIED 83 LUNG CA Mother: DIED 58 PANCREATIC CA Siblings: 10 , HIGH CHOL, CRA CV: GRANDPARENTS +MI DM: SON BREAST CA: SISTER LUNG CA: Marta Antu [facility] [phone]  Social History: Reviewed history from 02/01/2007 and no changes required.  Married X 62 years then widowed Children: 4 children--2 daughter and 1 son living, 1 son suicide at age 68 Retired--[employer] Hobbies--flowers, reading, geneology  and sewing Current Smoker  Review of Systems       weight up 4# appetite is good sleeps pretty well bowels okay  Physical Exam  General:  alert and normal appearance.   Neck:  supple, no masses, no thyromegaly, no carotid bruits, and no cervical lymphadenopathy.   Lungs:  normal respiratory effort, no crackles, and no wheezes.  Decreased breath sounds Heart:  normal rate, no murmur, no gallop, and irregular rhythm.   Abdomen:  soft and non-tender.   Extremities:  no sig edema Neurologic:  walks with cane Skin:  wart to left  of nose seb keratosis just under right ear Psych:  normally interactive, good eye contact, not anxious appearing, and not depressed appearing.     Impression & Recommendations:  Problem # 1:  COPD (ICD-496) Assessment Unchanged NYHA class 3 symptoms seems to be mostly pulmonary no changes  Her updated medication list for this problem includes:    Combivent 103-18 Mcg/act Aero (  Albuterol-ipratropium) ..... Use 2 inhalations 4 times daily  Problem # 2:  CHRONIC DIASTOLIC HEART FAILURE (ICD-428.32) Assessment: Unchanged occ ?angina NTG helps neutral fluid status  Her updated medication list for this problem includes:    Furosemide 40 Mg Tabs (Furosemide) .Marland Kitchen... 1 tab daily    Warfarin Sodium 4 Mg Tabs (Warfarin sodium) .Marland Kitchen... Take 1 1/2 tabs daily as directed    Toprol Xl 25 Mg Tb24 (Metoprolol  succinate) .Marland Kitchen... Take 1 tablet by mouth once a day  Problem # 3:  ATRIAL FIBRILLATION (ICD-427.31) Assessment: Unchanged good rate control on coumadin  Her updated medication list for this problem includes:    Warfarin Sodium 4 Mg Tabs (Warfarin sodium) .Marland Kitchen... Take 1 1/2 tabs daily as directed    Toprol Xl 25 Mg Tb24 (Metoprolol succinate) .Marland Kitchen... Take 1 tablet by mouth once a day  Problem # 4:  ANXIETY (ICD-300.00) Assessment: Unchanged does okay with the xanax  Her updated medication list for this problem includes:    Alprazolam 0.5 Mg Tabs (Alprazolam) .Marland Kitchen... Take 1 tablet by mouth three times a day as needed for anxiety  Problem # 5:  HYPERTENSION (ICD-401.9) Assessment: Unchanged good control  Her updated medication list for this problem includes:    Furosemide 40 Mg Tabs (Furosemide) .Marland Kitchen... 1 tab daily    Toprol Xl 25 Mg Tb24 (Metoprolol succinate) .Marland Kitchen... Take 1 tablet by mouth once a day  BP today: 128/80 Prior BP: 120/68 (04/08/2009)  Labs Reviewed: K+: 4.9 (03/03/2009) Creat: : 1.3 (03/03/2009)   Chol: 276 (01/01/2007)   HDL: 51.0 (01/01/2007)   LDL: DEL (01/01/2007)   TG: 234 (01/01/2007)  Complete Medication List: 1)  Alprazolam 0.5 Mg Tabs (Alprazolam) .... Take 1 tablet by mouth three times a day as needed for anxiety 2)  Combivent 103-18 Mcg/act Aero (Albuterol-ipratropium) .... Use 2 inhalations 4 times daily 3)  Furosemide 40 Mg Tabs (Furosemide) .Marland Kitchen.. 1 tab daily 4)  Warfarin Sodium 4 Mg Tabs (Warfarin sodium) .... Take 1 1/2 tabs daily as directed 5)  Toprol Xl 25 Mg Tb24 (Metoprolol succinate) .... Take 1 tablet by mouth once a day 6)  Nitroglycerin 0.4 Mg Subl (Nitroglycerin) .Marland Kitchen.. 1 under tongue as needed for chest pain. may repeat twice if needed 7)  Vitamin D 1000 Unit Tabs (Cholecalciferol) .Marland Kitchen.. 1 tab daily 8)  Loratadine 10 Mg Tabs (Loratadine) .Marland Kitchen.. 1 tab daily as needed for runny nose  Other Orders: TLB-Renal Function Panel (80069-RENAL) Venipuncture  (16109)  Patient Instructions: 1)  Please schedule a follow-up appointment in 4 months .  Prescriptions: ALPRAZOLAM 0.5 MG TABS (ALPRAZOLAM) Take 1 tablet by mouth three times a day as needed for anxiety  #90 x 1   Entered and Authorized by:   Cindee Salt MD   Signed by:   Cindee Salt MD on 08/12/2009   Method used:   Print then Give to Patient   RxID:   [id number]   Current Allergies (reviewed today): PREDNISONE (PREDNISONE) * CLINDAMYCIN CODEINE PHOSPHATE (CODEINE PHOSPHATE) TRAMADOL HCL (TRAMADOL HCL)

## 2010-03-15 NOTE — Assessment & Plan Note (Signed)
Summary: 2 M F/U DLO   Vital Signs:  Patient profile:   77 year old female O2 Sat:      92 % on 2 L/min Temp:     98.3 degrees F oral Pulse rate:   91 / minute Pulse rhythm:   regular BP sitting:   130 / 70  (left arm) Cuff size:   regular  Vitals Entered By: Mervin Hack CMA Duncan Dull) (December 23, 2009 2:59 PM)  O2 Flow:  2 L/min CC: 2 month follow-up   History of Present Illness: Not doing so well left leg still has pain --esp at ankle still slightly pink No ulcers mostly hurts in bed--like when she moves it against the sheets doesn't hurt with her limited walking--legs just feel weak  More cough recently some increase in dyspnea uses 3l/min oxygen on concentrator at home some dyspnea with personal care  Showers in chair--does it herself  Has had occ sharp chest pain has used NTG about 3 times with some success No clear palpitations  still smokes several cigarettes per day  Still lives on her own [place] and son bring in food  uses the xanax two times a day in general able to sleep despite the pain   Preventive Screening-Counseling & Management  Alcohol-Tobacco     Smoking Status: current     Smoke Cessation Stage: precontemplative     Tobacco Counseling: to quit use of tobacco products  Allergies: 1)  Prednisone (Prednisone) 2)  * Clindamycin 3)  Codeine Phosphate (Codeine Phosphate) 4)  Tramadol Hcl (Tramadol Hcl)  Past History:  Past medical, surgical, family and social histories (including risk factors) reviewed for relevance to current acute and chronic problems.  Past Medical History: Reviewed history from 04/02/2008 and no changes required. COPD  ?liver damage secondary to cholesterol medicine CHF--diastolic Anxiety Atrial fibrillation Colonic polyps, hx of Hypertension Osteoporosis Peripheral vascular disease Renal insufficiency Constipation  Past Surgical History: Reviewed history from 03/22/2007 and no changes  required. Hysterectomy-partial Appendectomy Lumbar disc repair Detached retina Cataract removal Echo--mod AS EF-51%   3/07 DEXA - Osteoporosis Hosp-- Pneumonia with bilat effusions 04/2005 Carotid doppler  05/03/05 Barium enema (-) 05/03/05 Bilateral cataract surgery 10/2005 BXx - colitis-- 6/05  Family History: Reviewed history from 05/11/2006 and no changes required. Father: DIED 20 LUNG CA Mother: DIED 73 PANCREATIC CA Siblings: 10 , HIGH CHOL, CRA CV: GRANDPARENTS +MI DM: SON BREAST CA: SISTER LUNG CA: Marta Antu [facility] [phone]  Social History: Reviewed history from 02/01/2007 and no changes required.  Married X 62 years then widowed Children: 4 children--2 daughter and 1 son living, 1 son suicide at age 66 Retired--[employer] Hobbies--flowers, reading, geneology  and sewing Current Smoker  Review of Systems       sleeping okay appetite is fair she feels that she may have gained some weight  Physical Exam  General:  alert.  NAD Neck:  supple, no masses, and no cervical lymphadenopathy.   Lungs:  normal respiratory effort, no intercostal retractions, no accessory muscle use, no crackles, and no wheezes.  Decreased breath sounds but clear Heart:  normal rate, no murmur, no gallop, and irregular rhythm.   Abdomen:  soft and non-tender.   Pulses:  faint pulse on left, 1+ on right Extremities:  no edema Skin:  no suspicious lesions and no ulcerations.   Psych:  normally interactive, good eye contact, not anxious appearing, and not depressed appearing.     Impression & Recommendations:  Problem #  1:  COPD (ICD-496) Assessment Deteriorated worsening disability upset that she can't do housework, etc discussed giving up cigarettes but she is not willing to no changes in meds  Her updated medication list for this problem includes:    Combivent 103-18 Mcg/act Aero (Albuterol-ipratropium) ..... Use 2 inhalations 4 times daily  Problem # 2:   ATRIAL FIBRILLATION (ICD-427.31) Assessment: Unchanged good rate control on coumadin  Her updated medication list for this problem includes:    Warfarin Sodium 4 Mg Tabs (Warfarin sodium) .Marland Kitchen... Take 1 1/2 tabs daily as directed    Toprol Xl 25 Mg Tb24 (Metoprolol succinate) .Marland Kitchen... Take 1 tablet by mouth once a day  Problem # 3:  CHRONIC DIASTOLIC HEART FAILURE (ICD-428.32) Assessment: Unchanged NYHA class 3 disability but may be more from her lungs neutral fluid status  Her updated medication list for this problem includes:    Furosemide 40 Mg Tabs (Furosemide) .Marland Kitchen... 1 tab daily    Warfarin Sodium 4 Mg Tabs (Warfarin sodium) .Marland Kitchen... Take 1 1/2 tabs daily as directed    Toprol Xl 25 Mg Tb24 (Metoprolol succinate) .Marland Kitchen... Take 1 tablet by mouth once a day  Problem # 4:  HYPERTENSION (ICD-401.9) Assessment: Unchanged good control no changes needed Her updated medication list for this problem includes:    Furosemide 40 Mg Tabs (Furosemide) .Marland Kitchen... 1 tab daily    Toprol Xl 25 Mg Tb24 (Metoprolol succinate) .Marland Kitchen... Take 1 tablet by mouth once a day  BP today: 130/70 Prior BP: 142/70 (11/16/2009)  Labs Reviewed: K+: 4.3 (08/12/2009) Creat: : 1.0 (08/12/2009)   Chol: 276 (01/01/2007)   HDL: 51.0 (01/01/2007)   LDL: DEL (01/01/2007)   TG: 234 (01/01/2007)  Problem # 5:  PERIPHERAL VASCULAR DISEASE (ICD-443.9) Assessment: Comment Only foot pain doesn't seem vascular--more like neuropathy no meds since can sleep despite it  Complete Medication List: 1)  Alprazolam 0.5 Mg Tabs (Alprazolam) .... Take 1 tablet by mouth three times a day as needed for anxiety 2)  Combivent 103-18 Mcg/act Aero (Albuterol-ipratropium) .... Use 2 inhalations 4 times daily 3)  Furosemide 40 Mg Tabs (Furosemide) .Marland Kitchen.. 1 tab daily 4)  Warfarin Sodium 4 Mg Tabs (Warfarin sodium) .... Take 1 1/2 tabs daily as directed 5)  Toprol Xl 25 Mg Tb24 (Metoprolol succinate) .... Take 1 tablet by mouth once a day 6)   Nitroglycerin 0.4 Mg Subl (Nitroglycerin) .Marland Kitchen.. 1 under tongue as needed for chest pain. may repeat twice if needed 7)  Vitamin D 1000 Unit Tabs (Cholecalciferol) .Marland Kitchen.. 1 tab daily 8)  Loratadine 10 Mg Tabs (Loratadine) .Marland Kitchen.. 1 tab daily as needed for runny nose  Patient Instructions: 1)  Please schedule a follow-up appointment in 4 months .    Orders Added: 1)  Est. Patient Level IV [16109]    Current Allergies (reviewed today): PREDNISONE (PREDNISONE) * CLINDAMYCIN CODEINE PHOSPHATE (CODEINE PHOSPHATE) TRAMADOL HCL (TRAMADOL HCL)

## 2010-03-15 NOTE — Assessment & Plan Note (Signed)
Summary: 1 M F/U DLO   Vital Signs:  Patient profile:   77 year old female Weight:      102 pounds O2 Sat:      95 % on 2 L/min Temp:     97.9 degrees F tympanic Pulse rate:   86 / minute Pulse rhythm:   regular Resp:     18 per minute BP sitting:   120 / 68  (left arm) Cuff size:   regular  Vitals Entered By: Mervin Hack CMA Duncan Dull) (April 08, 2009 2:45 PM)  O2 Flow:  2 L/min CC: 1 month follow-up   History of Present Illness: Still doesn't feel great Still slow--shakes a lot Bothered by her tremor-hard to write even  Eating fairly well, for her. Small freq meals or snacks  Daughter feels she is better Feels her functional status is about the same Definite improvement from last time per daughter  Occ dperessed feelings--able to "talk it out" Only occ anxiety--regular wtih the alprazolam  Allergies: 1)  Prednisone (Prednisone) 2)  * Clindamycin 3)  Codeine Phosphate (Codeine Phosphate) 4)  Tramadol Hcl (Tramadol Hcl)  Past History:  Past medical, surgical, family and social histories (including risk factors) reviewed for relevance to current acute and chronic problems.  Past Medical History: Reviewed history from 04/02/2008 and no changes required. COPD  ?liver damage secondary to cholesterol medicine CHF--diastolic Anxiety Atrial fibrillation Colonic polyps, hx of Hypertension Osteoporosis Peripheral vascular disease Renal insufficiency Constipation  Past Surgical History: Reviewed history from 03/22/2007 and no changes required. Hysterectomy-partial Appendectomy Lumbar disc repair Detached retina Cataract removal Echo--mod AS EF-51%   3/07 DEXA - Osteoporosis Hosp-- Pneumonia with bilat effusions 04/2005 Carotid doppler  05/03/05 Barium enema (-) 05/03/05 Bilateral cataract surgery 10/2005 BXx - colitis-- 6/05  Family History: Reviewed history from 05/11/2006 and no changes required. Father: DIED 45 LUNG CA Mother: DIED 12 PANCREATIC  CA Siblings: 10 , HIGH CHOL, CRA CV: GRANDPARENTS +MI DM: SON BREAST CA: SISTER LUNG CA: Marta Antu [facility] [phone]  Social History: Reviewed history from 02/01/2007 and no changes required.  Married X 62 years then widowed Children: 4 children--2 daughter and 1 son living, 1 son suicide at age 73 Retired--[employer] Hobbies--flowers, reading, geneology  and sewing Current Smoker  Review of Systems       sleeps okay weight is up 2# no sig edema  Physical Exam  General:  alert and normal appearance.   Psych:  normally interactive, good eye contact, not anxious appearing, and not depressed appearing.     Impression & Recommendations:  Problem # 1:  COPD (ICD-496) Assessment Improved functional status seems better on higher oxygen---does make her nose run though  Her updated medication list for this problem includes:    Combivent 103-18 Mcg/act Aero (Albuterol-ipratropium) ..... Use 2 inhalations 4 times daily  Problem # 2:  ANXIETY (ICD-300.00) Assessment: Improved overall mood is better  Her updated medication list for this problem includes:    Alprazolam 0.5 Mg Tabs (Alprazolam) .Marland Kitchen... Take 1 tablet by mouth three times a day as needed for anxiety  Complete Medication List: 1)  Alprazolam 0.5 Mg Tabs (Alprazolam) .... Take 1 tablet by mouth three times a day as needed for anxiety 2)  Combivent 103-18 Mcg/act Aero (Albuterol-ipratropium) .... Use 2 inhalations 4 times daily 3)  Furosemide 40 Mg Tabs (Furosemide) .Marland Kitchen.. 1 tab daily 4)  Warfarin Sodium 4 Mg Tabs (Warfarin sodium) .... Take 1 1/2 tabs daily as directed 5)  Toprol Xl 25 Mg Tb24 (Metoprolol succinate) .... Take 1 tablet by mouth once a day 6)  Nitroglycerin 0.4 Mg Subl (Nitroglycerin) .Marland Kitchen.. 1 under tongue as needed for chest pain. may repeat twice if needed 7)  Vitamin D 1000 Unit Tabs (Cholecalciferol) .Marland Kitchen.. 1 tab daily 8)  Loratadine 10 Mg Tabs (Loratadine) .Marland Kitchen.. 1 tab daily as  needed for runny nose  Patient Instructions: 1)  Please schedule a follow-up appointment in 4 months .   Current Allergies (reviewed today): PREDNISONE (PREDNISONE) * CLINDAMYCIN CODEINE PHOSPHATE (CODEINE PHOSPHATE) TRAMADOL HCL (TRAMADOL HCL)

## 2010-03-15 NOTE — Assessment & Plan Note (Signed)
Summary: LEFT LEG/CLE   Vital Signs:  Patient profile:   77 year old female Weight:      110 pounds Temp:     97.5 degrees F oral Pulse rate:   88 / minute Pulse rhythm:   regular BP sitting:   142 / 70  (left arm) Cuff size:   regular  Vitals Entered By: Mervin Hack CMA Duncan Dull) (November 16, 2009 12:20 PM) CC: left leg swollen   History of Present Illness: Still with left leg swelling Now just looking red some cramps but not especially painful did hurt about 1 week ago (intermittent) does occ awaken her though--painful  No fever No open areas  still with fungal toenails  Soaks it nightly-- hot water  Vick's vaporub nightly under nails  Allergies: 1)  Prednisone (Prednisone) 2)  * Clindamycin 3)  Codeine Phosphate (Codeine Phosphate) 4)  Tramadol Hcl (Tramadol Hcl)  Past History:  Past medical, surgical, family and social histories (including risk factors) reviewed for relevance to current acute and chronic problems.  Past Medical History: Reviewed history from 04/02/2008 and no changes required. COPD  ?liver damage secondary to cholesterol medicine CHF--diastolic Anxiety Atrial fibrillation Colonic polyps, hx of Hypertension Osteoporosis Peripheral vascular disease Renal insufficiency Constipation  Past Surgical History: Reviewed history from 03/22/2007 and no changes required. Hysterectomy-partial Appendectomy Lumbar disc repair Detached retina Cataract removal Echo--mod AS EF-51%   3/07 DEXA - Osteoporosis Hosp-- Pneumonia with bilat effusions 04/2005 Carotid doppler  05/03/05 Barium enema (-) 05/03/05 Bilateral cataract surgery 10/2005 BXx - colitis-- 6/05  Family History: Reviewed history from 05/11/2006 and no changes required. Father: DIED 49 LUNG CA Mother: DIED 71 PANCREATIC CA Siblings: 10 , HIGH CHOL, CRA CV: GRANDPARENTS +MI DM: SON BREAST CA: SISTER LUNG CA: Marta Antu [facility] [phone]  Social History: Reviewed  history from 02/01/2007 and no changes required.  Married X 62 years then widowed Children: 4 children--2 daughter and 1 son living, 1 son suicide at age 61 Retired--[employer] Hobbies--flowers, reading, geneology  and sewing Current Smoker  Review of Systems       takes the furosemide daily Chronic SOB--may be some worse lately some increased cough  Physical Exam  General:  alert.  NAD Neck:  supple and no masses.   Lungs:  normal respiratory effort, no intercostal retractions, and no accessory muscle use.  Decreased breath sounds but clear Heart:  distant sounds that are heard subxiphoid Gr 2/6 aortic systolic murmur normal rate and irregular rhythm.   Extremities:  1+ edema in left foot and ankle and into distal calf No sig edema on right Skin:  redness along left toes and foot not really warm or exp tender Psych:  normally interactive, good eye contact, not anxious appearing, and not depressed appearing.     Impression & Recommendations:  Problem # 1:  CELLULITIS, FOOT (ICD-682.7) Assessment New  not clear cut but some pain and redness has been manipulating nails nightly--could have introduced infection  will treat with keflex stop the Vick's treatment  Her updated medication list for this problem includes:    Cephalexin 500 Mg Tabs (Cephalexin) .Marland Kitchen... 1 tab by mouth two times a day for infection in foot  Problem # 2:  EDEMA- LOCALIZED (ICD-782.3) Assessment: Unchanged persists some orthostatic symptoms so not approp to increase the diuretic will have her try support hose again  Her updated medication list for this problem includes:    Furosemide 40 Mg Tabs (Furosemide) .Marland Kitchen... 1 tab daily  Problem # 3:  COPD (ICD-496) Assessment: Comment Only stable some cough but no clear resp infection  Her updated medication list for this problem includes:    Combivent 103-18 Mcg/act Aero (Albuterol-ipratropium) ..... Use 2 inhalations 4 times daily  Complete  Medication List: 1)  Alprazolam 0.5 Mg Tabs (Alprazolam) .... Take 1 tablet by mouth three times a day as needed for anxiety 2)  Combivent 103-18 Mcg/act Aero (Albuterol-ipratropium) .... Use 2 inhalations 4 times daily 3)  Furosemide 40 Mg Tabs (Furosemide) .Marland Kitchen.. 1 tab daily 4)  Warfarin Sodium 4 Mg Tabs (Warfarin sodium) .... Take 1 1/2 tabs daily as directed 5)  Toprol Xl 25 Mg Tb24 (Metoprolol succinate) .... Take 1 tablet by mouth once a day 6)  Nitroglycerin 0.4 Mg Subl (Nitroglycerin) .Marland Kitchen.. 1 under tongue as needed for chest pain. may repeat twice if needed 7)  Vitamin D 1000 Unit Tabs (Cholecalciferol) .Marland Kitchen.. 1 tab daily 8)  Loratadine 10 Mg Tabs (Loratadine) .Marland Kitchen.. 1 tab daily as needed for runny nose 9)  Cephalexin 500 Mg Tabs (Cephalexin) .Marland Kitchen.. 1 tab by mouth two times a day for infection in foot  Patient Instructions: 1)  Please stop the soaking and Vick's treatment for foot 2)  Please use knee high hose during the day (remove at night) 3)  Keep upcoming appt in November Prescriptions: CEPHALEXIN 500 MG TABS (CEPHALEXIN) 1 tab by mouth two times a day for infection in foot  #14 x 0   Entered and Authorized by:   Cindee Salt MD   Signed by:   Cindee Salt MD on 11/16/2009   Method used:   Electronically to        [facility]* (retail)       [address]       [place], [place]  [address]       Ph: [phone]       Fax: [phone]   RxID:   [id number]   Current Allergies (reviewed today): PREDNISONE (PREDNISONE) * CLINDAMYCIN CODEINE PHOSPHATE (CODEINE PHOSPHATE) TRAMADOL HCL (TRAMADOL HCL)  Appended Document: LEFT LEG/CLE    Clinical Lists Changes  Orders: Added new Service order of [facility] [phone]) - Signed Observations: Added new observation of PT PATIENT: 25.5 s (11/16/2009 13:44) Added new observation of ABNORM BLEED: No  (11/16/2009 13:44) Added new observation of COUMADIN CHG: No  (11/16/2009 13:44) Added new observation of COUM TAB MG:   6mg  qd, 4mg T,TH,SAT   (11/16/2009 13:44) Added new observation of CUR. REGIMEN:  6mg  qd, 4mg T,TH,SAT   (11/16/2009 13:44) Added new observation of NEXT PT: 4 weeks days (11/16/2009 13:44) Added new observation of INR: 2.1  (11/16/2009 13:44)       ANTICOAGULATION RECORD PREVIOUS REGIMEN & LAB RESULTS Anticoagulation Diagnosis:  Atrial fibrillation on  03/08/2009 Previous INR Goal Range:  2.0-3.0 on  03/08/2009 Previous INR:  3.1 on  10/26/2009 Previous Coumadin Dose(mg):  4 mg daily, 6 mg tues, thurs,sat on  03/08/2009 Previous Regimen:  6mg  qd, 4mg T,TH,SAT on  10/14/2009 Previous Coagulation Comments:  starting to drift up, adjusted dose. on  10/14/2009  NEW REGIMEN & LAB RESULTS Current INR: 2.1 Current Coumadin Dose(mg):  6mg  qd, 4mg T,TH,SAT  Regimen:  6mg  qd, 4mg T,TH,SAT   Jaelin Devincentis: letvak Repeat testing in: 4 weeks Dose has been reviewed with patient or caretaker during this visit. Reviewed by: Celso Sickle  Anticoagulation Visit Questionnaire Coumadin dose missed/changed:  No Abnormal Bleeding Symptoms:  No  Any diet changes including alcohol intake, vegetables or greens since  the last visit:  No Any illnesses or hospitalizations since the last visit:  No Any signs of clotting since the last visit (including chest discomfort, dizziness, shortness of breath, arm tingling, slurred speech, swelling or redness in leg):  No  MEDICATIONS ALPRAZOLAM 0.5 MG TABS (ALPRAZOLAM) Take 1 tablet by mouth three times a day as needed for anxiety COMBIVENT 103-18 MCG/ACT  AERO (ALBUTEROL-IPRATROPIUM) Use 2 inhalations 4 times daily FUROSEMIDE 40 MG  TABS (FUROSEMIDE) 1 tab daily WARFARIN SODIUM 4 MG  TABS (WARFARIN SODIUM) Take 1 1/2 tabs daily as directed TOPROL XL 25 MG  TB24 (METOPROLOL SUCCINATE) Take 1 tablet by mouth once a day NITROGLYCERIN 0.4 MG  SUBL (NITROGLYCERIN) 1 under tongue as needed for chest pain. May repeat twice if needed VITAMIN D 1000 UNIT TABS  (CHOLECALCIFEROL) 1 tab daily LORATADINE 10 MG TABS (LORATADINE) 1 tab daily as needed for runny nose CEPHALEXIN 500 MG TABS (CEPHALEXIN) 1 tab by mouth two times a day for infection in foot    Laboratory Results   Blood Tests   Date/Time Received: November 16, 2009 1:46 PM Date/Time Reported: November 16, 2009 1:46 PM  PT: 25.5 s   (Normal Range: 10.6-13.4)  INR: 2.1   (Normal Range: 0.88-1.12   Therap INR: 2.0-3.5)

## 2010-03-15 NOTE — Progress Notes (Signed)
Summary: refill request for alprazolam  Phone Note Refill Request Message from:  Fax from Pharmacy  Refills Requested: Medication #1:  ALPRAZOLAM 0.5 MG TABS Take 1 tablet by mouth three times a day as needed for anxiety   Last Refilled: 04/20/2009 Faxed request from [place], phone [phone].  Initial call taken by: Lowella Petties CMA,  May 18, 2009 11:55 AM  Follow-up for Phone Call        okay #90 x 1 Follow-up by: Cindee Salt MD,  May 18, 2009 1:17 PM  Additional Follow-up for Phone Call Additional follow up Details #1::        Rx called to pharmacy Additional Follow-up by: DeShannon Smith CMA Duncan Dull),  May 18, 2009 2:06 PM    Prescriptions: ALPRAZOLAM 0.5 MG TABS (ALPRAZOLAM) Take 1 tablet by mouth three times a day as needed for anxiety  #90 x 1   Entered by:   Mervin Hack CMA (AAMA)   Authorized by:   Cindee Salt MD   Signed by:   Mervin Hack CMA (AAMA) on 05/18/2009   Method used:   Telephoned to ...       [facility]* (retail)       [address]       [place], [place]  [address]       Ph: [phone]       Fax: [phone]   RxID:   [id number]

## 2010-03-15 NOTE — Progress Notes (Signed)
Summary: alprazolam  Phone Note Refill Request Message from:  Fax from Pharmacy on November 16, 2009 1:15 PM  Refills Requested: Medication #1:  ALPRAZOLAM 0.5 MG TABS Take 1 tablet by mouth three times a day as needed for anxiety   Last Refilled: 09/18/2009 Refill request from [place]. [phone]. Form is on your desk.   Initial call taken by: Melody Comas,  November 16, 2009 1:16 PM  Follow-up for Phone Call        okay #90 x 1 Follow-up by: Cindee Salt MD,  November 16, 2009 1:58 PM  Additional Follow-up for Phone Call Additional follow up Details #1::        Rx faxed to pharmacy Additional Follow-up by: DeShannon Smith CMA Duncan Dull),  November 16, 2009 2:57 PM    Prescriptions: ALPRAZOLAM 0.5 MG TABS (ALPRAZOLAM) Take 1 tablet by mouth three times a day as needed for anxiety  #90 x 1   Entered by:   Mervin Hack CMA (AAMA)   Authorized by:   Cindee Salt MD   Signed by:   Mervin Hack CMA (AAMA) on 11/16/2009   Method used:   Handwritten   RxID:   [id number]

## 2010-03-15 NOTE — Assessment & Plan Note (Signed)
Summary: FOOT AND ANKLE ARE SWOLLEN/ 1:30   Vital Signs:  Patient profile:   77 year old female Weight:      110 pounds O2 Sat:      94 % on Room air Temp:     97.9 degrees F oral Pulse rate:   88 / minute Pulse rhythm:   regular BP sitting:   158 / 70  (left arm) Cuff size:   regular  Vitals Entered By: Mervin Hack CMA Duncan Dull) (October 14, 2009 1:38 PM)  O2 Flow:  Room air CC: left foot & ankle swelling   History of Present Illness: Has noted some swelling in left foot and ankle Noted about 7-10 days ago Doesn't remember any injury No sig pain some redness No known cuts---has been working on fungal toenails with file and liquid spray  No fever Feels off some in vague way  Had chest pain one night--center of chest (woke her) felt it was heartburn  then went into her back Did try a NTG without effect. Then it resolved Did have a lot of burping Didn't use antacid  Allergies: 1)  Prednisone (Prednisone) 2)  * Clindamycin 3)  Codeine Phosphate (Codeine Phosphate) 4)  Tramadol Hcl (Tramadol Hcl)  Past History:  Past medical, surgical, family and social histories (including risk factors) reviewed for relevance to current acute and chronic problems.  Past Medical History: Reviewed history from 04/02/2008 and no changes required. COPD  ?liver damage secondary to cholesterol medicine CHF--diastolic Anxiety Atrial fibrillation Colonic polyps, hx of Hypertension Osteoporosis Peripheral vascular disease Renal insufficiency Constipation  Past Surgical History: Reviewed history from 03/22/2007 and no changes required. Hysterectomy-partial Appendectomy Lumbar disc repair Detached retina Cataract removal Echo--mod AS EF-51%   3/07 DEXA - Osteoporosis Hosp-- Pneumonia with bilat effusions 04/2005 Carotid doppler  05/03/05 Barium enema (-) 05/03/05 Bilateral cataract surgery 10/2005 BXx - colitis-- 6/05  Family History: Reviewed history from 05/11/2006 and  no changes required. Father: DIED 5 LUNG CA Mother: DIED 67 PANCREATIC CA Siblings: 10 , HIGH CHOL, CRA CV: GRANDPARENTS +MI DM: SON BREAST CA: SISTER LUNG CA: Marta Antu [facility] [phone]  Social History: Reviewed history from 02/01/2007 and no changes required.  Married X 62 years then widowed Children: 4 children--2 daughter and 1 son living, 1 son suicide at age 62 Retired--[employer] Hobbies--flowers, reading, geneology  and sewing Current Smoker  Review of Systems       The patient complains of dyspnea on exertion.  The patient denies syncope.         breathing may be slightly worse occ dizziness when she raises her head while in bed  Physical Exam  General:  alert.  NAD Neck:  supple, no masses, no thyromegaly, and no cervical lymphadenopathy.   Lungs:  normal respiratory effort, no intercostal retractions, and no accessory muscle use.  Decreased breath sounds but clear Heart:  distant sounds that are heard subxiphoid Gr 2/6 aortic systolic murmurnormal rate and irregular rhythm.   Extremities:  1+ edema in distal left calf No calf tenderness--homan's absent No cords Multple dilated veins in legs/feet bilat Psych:  normally interactive and good eye contact.     Impression & Recommendations:  Problem # 1:  EDEMA- LOCALIZED (ICD-782.3) Assessment New seems to be venous insufficiency nothing to suggest DVT discussed elevating legs  Her updated medication list for this problem includes:    Furosemide 40 Mg Tabs (Furosemide) .Marland Kitchen... 1 tab daily  Problem # 2:  CHEST PAIN (  ICD-786.50) Assessment: Comment Only  non  specific could be anginal but probably not NTG as needed  discussed trying antacid  Orders: EKG w/ Interpretation (93000)  Problem # 3:  ATRIAL FIBRILLATION (ICD-427.31)    good rate control on coumadin  Her updated medication list for this problem includes:    Warfarin Sodium 4 Mg Tabs (Warfarin sodium) .Marland Kitchen... Take 1  1/2 tabs daily as directed    Toprol Xl 25 Mg Tb24 (Metoprolol succinate) .Marland Kitchen... Take 1 tablet by mouth once a day  Complete Medication List: 1)  Alprazolam 0.5 Mg Tabs (Alprazolam) .... Take 1 tablet by mouth three times a day as needed for anxiety 2)  Combivent 103-18 Mcg/act Aero (Albuterol-ipratropium) .... Use 2 inhalations 4 times daily 3)  Furosemide 40 Mg Tabs (Furosemide) .Marland Kitchen.. 1 tab daily 4)  Warfarin Sodium 4 Mg Tabs (Warfarin sodium) .... Take 1 1/2 tabs daily as directed 5)  Toprol Xl 25 Mg Tb24 (Metoprolol succinate) .... Take 1 tablet by mouth once a day 6)  Nitroglycerin 0.4 Mg Subl (Nitroglycerin) .Marland Kitchen.. 1 under tongue as needed for chest pain. may repeat twice if needed 7)  Vitamin D 1000 Unit Tabs (Cholecalciferol) .Marland Kitchen.. 1 tab daily 8)  Loratadine 10 Mg Tabs (Loratadine) .Marland Kitchen.. 1 tab daily as needed for runny nose  Patient Instructions: 1)  Please schedule a follow-up appointment in 2 months.   Current Allergies (reviewed today): PREDNISONE (PREDNISONE) * CLINDAMYCIN CODEINE PHOSPHATE (CODEINE PHOSPHATE) TRAMADOL HCL (TRAMADOL HCL)   EKG  Procedure date:  10/14/2009  Findings:      atrial fib @77  No ischemic changes   Appended Document: FOOT AND ANKLE ARE SWOLLEN/ 1:30       Influenza Vaccine    Vaccine Type: Fluvax 3+    Site: left deltoid    Mfr: GlaxoSmithKline    Dose: 0.5 ml    Route: IM    Given by: Mervin Hack CMA (AAMA)    Exp. Date: 08/13/2010    Lot #: [id number]    VIS given: 09/07/09 version given October 14, 2009.  Flu Vaccine Consent Questions    Do you have a history of severe allergic reactions to this vaccine? no    Any prior history of allergic reactions to egg and/or gelatin? no    Do you have a sensitivity to the preservative Thimersol? no    Do you have a past history of Guillan-Barre Syndrome? no    Do you currently have an acute febrile illness? no    Have you ever had a severe reaction to latex? no    Vaccine  information given and explained to patient? yes    Are you currently pregnant? no  Appended Document: FOOT AND ANKLE ARE SWOLLEN/ 1:30  Laboratory Results   Blood Tests   Date/Time Recieved: October 14, 2009 2:45 PM  Date/Time Reported: October 14, 2009 2:45 PM   PT: 38.6 s   (Normal Range: 10.6-13.4)  INR: 3.2   (Normal Range: 0.88-1.12   Therap INR: 2.0-3.5)      ANTICOAGULATION RECORD PREVIOUS REGIMEN & LAB RESULTS Anticoagulation Diagnosis:  Atrial fibrillation on  03/08/2009 Previous INR Goal Range:  2.0-3.0 on  03/08/2009 Previous INR:  3.1 on  09/21/2009 Previous Coumadin Dose(mg):  4 mg daily, 6 mg tues, thurs,sat on  03/08/2009 Previous Regimen:  6mg  qd,4mg  T,TH on  03/22/2009 Previous Coagulation Comments:  . on  03/22/2009  NEW REGIMEN & LAB RESULTS Current INR: 3.2 Regimen: 6mg  qd, 4mg T,TH,SAT  Coagulation Comments: starting to drift up, adjusted dose. Provider: letvak      Repeat testing in: 2 weeks MEDICATIONS ALPRAZOLAM 0.5 MG TABS (ALPRAZOLAM) Take 1 tablet by mouth three times a day as needed for anxiety COMBIVENT 103-18 MCG/ACT  AERO (ALBUTEROL-IPRATROPIUM) Use 2 inhalations 4 times daily FUROSEMIDE 40 MG  TABS (FUROSEMIDE) 1 tab daily WARFARIN SODIUM 4 MG  TABS (WARFARIN SODIUM) Take 1 1/2 tabs daily as directed TOPROL XL 25 MG  TB24 (METOPROLOL SUCCINATE) Take 1 tablet by mouth once a day NITROGLYCERIN 0.4 MG  SUBL (NITROGLYCERIN) 1 under tongue as needed for chest pain. May repeat twice if needed VITAMIN D 1000 UNIT TABS (CHOLECALCIFEROL) 1 tab daily LORATADINE 10 MG TABS (LORATADINE) 1 tab daily as needed for runny nose  Dose has been reviewed with patient or caretaker during this visit.  Reviewed by: Allison Quarry  Anticoagulation Visit Questionnaire      Coumadin dose missed/changed:  No      Abnormal Bleeding Symptoms:  No Any diet changes including alcohol intake, vegetables or greens since the last visit:  No Any illnesses or  hospitalizations since the last visit:  No Any signs of clotting since the last visit (including chest discomfort, dizziness, shortness of breath, arm tingling, slurred speech, swelling or redness in leg):  No

## 2010-03-17 NOTE — Miscellaneous (Signed)
Summary: Discharge Instructions/[facility]  Discharge Instructions/[facility]   Imported By: Maryln Gottron 03/08/2010 10:30:51  _____________________________________________________________________  External Attachment:    Type:   Image     Comment:   External Document

## 2010-03-17 NOTE — Letter (Signed)
Summary: [facility]-Seizure  [facility]Seiz[facility]   Imported By: Maryln Gottron 02/16/2010 10:02:30  _____________________________________________________________________  External Attachment:    Type:   Image     Comment:   External Document

## 2010-03-17 NOTE — Progress Notes (Signed)
Summary: ? pnumonia  Phone Note Call from Patient   Caller: Patient, [name] Summary of Call: Pt's daughter states pt has ? pneumonia and needs to be seen today.  I advised her that there are no appts available, offered appt for tomorrow.  I spoke to pt, she is complaining of pain in her ribs and side.  No fever, no cough- but she does want to be seen today.  Advised urgent care at [facility].  She will call back if she needs to be seen tomorrow. Initial call taken by: Lowella Petties CMA, AAMA,  February 01, 2010 1:12 PM  Follow-up for Phone Call        please check on her tomorrow Follow-up by: Cindee Salt MD,  February 01, 2010 1:36 PM  Additional Follow-up for Phone Call Additional follow up Details #1::        Patient's daughter called to let you know that they have decided to take her to the ER instead of UC. She says that her mom is very weak and is having trouble breathing. She doesn't know if they are going to take her to [facility] or [facility] yet.  Melody Comas  February 01, 2010 1:49 PM  Okay---please check on her tomorrow Cindee Salt MD  February 01, 2010 1:58 PM      Additional Follow-up for Phone Call Additional follow up Details #2::    Pt's daughter, [name], called to let you know that pt is at [facility] with pneumonia.   Sandra's number is [phone].                 Lowella Petties CMA, AAMA  February 02, 2010 12:00 PM  Doing some better today spoke to her but couldn't hear me CT confirms right sided pneumonia IN [facility] Cindee Salt MD  February 02, 2010 1:45 PM   Pneumonia seems better but got delerious last night. Daughter and son spent all night trying to calm her down. may have been related to sleeping pill looking into rehab---[facility] has a bed now Cindee Salt MD  February 04, 2010 1:33 PM   now in [facility] spoke to daughter went to rehab 3 days ago had event yesterday--?seizure Readmitted ---- possible TIA Has MRI for today may  go back to rehab soon Cindee Salt MD  February 08, 2010 8:23 AM   Got bed offer at [facility] but it is double room and she doesn't want that Will go back to [facility] and hopes to bring her back home Follow-up by: Cindee Salt MD,  February 09, 2010 9:17 AM

## 2010-03-17 NOTE — Letter (Signed)
Summary: [facility]   Imported By: Lester Enon 02/15/2010 10:16:12  _____________________________________________________________________  External Attachment:    Type:   Image     Comment:   External Document

## 2010-03-17 NOTE — Progress Notes (Signed)
Summary: Wants mom at [facility]  Phone Note Call from Patient Call back at cell [phone] home [phone]   Caller: Daughter-Sandra Whitesell Call For: Cindee Salt MD Summary of Call: Patients daughter called and wanted to let Dr. Alphonsus Sias know that patient is in the hospital and will probably go back to [facility] when discharged.  Daughter would like Dr. Alphonsus Sias to request that patient get into [facility] because the family feels that Dr. Alphonsus Sias can keep an eye on her if she goes to [facility].  Please advise.  Follow-up for Phone Call        Please let her know I have already requested attention by the admissions coordinator at [facility] but I don't think there are any beds I will contact her again though Follow-up by: Cindee Salt MD,  February 08, 2010 10:26 AM  Additional Follow-up for Phone Call Additional follow up Details #1::        [name] advised.  She said her mother is not going to be released today but may be tomorrow.  She said she would have to make arrangements at [facility] if [facility] doesn't work out.  Lugene Fuquay CMA Duncan Dull)  February 08, 2010 1:05 PM   Noted I did send an email to Amanda--the intake coordinator at [facility] Additional Follow-up by: Cindee Salt MD,  February 08, 2010 1:59 PM

## 2010-03-17 NOTE — Assessment & Plan Note (Signed)
Summary: HOSPITAL FOLLOW UP/[facility] OK DR Horton [place]   Vital Signs:  Patient profile:   77 year old female Weight:      113 pounds Temp:     97.7 degrees F oral Pulse rate:   76 / minute Pulse rhythm:   regular BP sitting:   120 / 59  (left arm) Cuff size:   regular  Vitals Entered By: Mervin Hack CMA Duncan Dull) (March 02, 2010 12:58 PM) CC: follow-up visit   History of Present Illness: Had seizure that prompted admission Neuro eval done MRI did show diffuse disease but no serious pathology No treatment given No further seizures  Breathing is bad at times oxygen does help uses nebulizer once a day in general  Had brief chest pain last night Took 2 nitro and it helped. Also took antacid liquid Occ feels heart irrgular--not striking  Lives alone Family has been with her part of the time--- at night and till lunch time (the 2 daughters) has cell phone and lifeline has housecleaner every 2 weeks  Allergies: 1)  Prednisone (Prednisone) 2)  * Clindamycin 3)  Codeine Phosphate (Codeine Phosphate) 4)  Tramadol Hcl (Tramadol Hcl)  Past History:  Past medical, surgical, family and social histories (including risk factors) reviewed for relevance to current acute and chronic problems.  Past Medical History: Reviewed history from 04/02/2008 and no changes required. COPD  ?liver damage secondary to cholesterol medicine CHF--diastolic Anxiety Atrial fibrillation Colonic polyps, hx of Hypertension Osteoporosis Peripheral vascular disease Renal insufficiency Constipation  Past Surgical History: Reviewed history from 03/22/2007 and no changes required. Hysterectomy-partial Appendectomy Lumbar disc repair Detached retina Cataract removal Echo--mod AS EF-51%   3/07 DEXA - Osteoporosis Hosp-- Pneumonia with bilat effusions 04/2005 Carotid doppler  05/03/05 Barium enema (-) 05/03/05 Bilateral cataract surgery 10/2005 BXx - colitis-- 6/05  Family History: Reviewed history  from 05/11/2006 and no changes required. Father: DIED 29 LUNG CA Mother: DIED 58 PANCREATIC CA Siblings: 10 , HIGH CHOL, CRA CV: GRANDPARENTS +MI DM: SON BREAST CA: SISTER LUNG CA: Marta Antu [facility] [phone]  Social History: Reviewed history from 02/01/2007 and no changes required.  Married X 62 years then widowed Children: 4 children--2 daughter and 1 son living, 1 son suicide at age 17 Retired--[employer] Hobbies--flowers, reading, geneology  and sewing Current Smoker  Review of Systems       some positional dizziness--worse today weight is stable appetite is fair  Physical Exam  General:  alert.  NAD Neck:  supple, no masses, and no cervical lymphadenopathy.   Lungs:  normal respiratory effort, no intercostal retractions, and no accessory muscle use.  DEcreased breath sounds but clear Heart:  normal rate, no gallop, and irregular rhythm.   Abdomen:  soft and non-tender.   Extremities:  no edema Psych:  normally interactive, good eye contact, not anxious appearing, and not depressed appearing.     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 Use 2 inhalations 4 times daily Her updated medication list for this problem includes:    Ipratropium Bromide 0.02 % Soln (Ipratropium bromide) .Marland Kitchen... 1 vial every 4 hours as needed for shortness of breath  Spiriva Handihaler 18 Mcg Caps (Tiotropium bromide monohydrate) ..... Inhale 1 capsule once daily    Albuterol Sulfate (2.5 Mg/44ml) 0.083% Nebu (Albuterol sulfate) .Marland Kitchen... 1 vial via nebulizer up to four times daily as  needed  Problem # 3:  ATRIAL FIBRILLATION (ICD-427.31) Assessment: Unchanged good rate control on coumadin  The following medications were removed from the medication list:    Warfarin Sodium 4 Mg Tabs (Warfarin sodium) .Marland Kitchen... Take 1 1/2 tabs daily as directed Her updated medication list for this problem includes:    Toprol Xl 25 Mg Tb24 (Metoprolol succinate) .Marland Kitchen... Take 1 tablet by mouth once a day    Warfarin Sodium 5 Mg Tabs (Warfarin sodium) .Marland Kitchen... As directed    Warfarin Sodium 7.5 Mg Tabs (Warfarin sodium) .Marland Kitchen... As directed  Problem # 4:  CHRONIC DIASTOLIC HEART FAILURE (ICD-428.32) Assessment: Unchanged  stable status will recheck potassium and consider stopping  The following medications were removed from the medication list:    Warfarin Sodium 4 Mg Tabs (Warfarin sodium) .Marland Kitchen... Take 1 1/2 tabs daily as directed Her updated medication list for this problem includes:    Furosemide 40 Mg Tabs (Furosemide) .Marland Kitchen... 1 tab daily    Toprol Xl 25 Mg Tb24 (Metoprolol succinate) .Marland Kitchen... Take 1 tablet by mouth once a day    Warfarin Sodium 5 Mg Tabs (Warfarin sodium) .Marland Kitchen... As directed    Warfarin Sodium 7.5 Mg Tabs (Warfarin sodium) .Marland Kitchen... As directed  Orders: Venipuncture (91478) TLB-Renal Function Panel (80069-RENAL) TLB-CBC Platelet - w/Differential (85025-CBCD)  Complete Medication List: 1)  Alprazolam 0.5 Mg Tabs (Alprazolam) .... Take 1 tablet by mouth three times a day as needed for anxiety 2)  Furosemide 40 Mg Tabs (Furosemide) .Marland Kitchen.. 1 tab daily 3)  Toprol Xl 25 Mg Tb24 (Metoprolol succinate) .... Take 1 tablet by mouth once a day 4)  Ipratropium Bromide 0.02 % Soln (Ipratropium bromide) .Marland Kitchen.. 1 vial every 4 hours as needed for shortness of breath 5)  Spiriva Handihaler 18 Mcg Caps (Tiotropium bromide monohydrate) .... Inhale 1 capsule once daily 6)  Warfarin Sodium 5 Mg Tabs (Warfarin sodium) .... As directed 7)  Warfarin Sodium 7.5 Mg Tabs (Warfarin sodium) .... As directed 8)   Klor-con 10 10 Meq Cr-tabs (Potassium chloride) .... Take 1 by mouth once daily 9)  Trazodone Hcl 50 Mg Tabs (Trazodone hcl) .... Take 1 by mouth once daily at bedtime as needed 10)  Calcium-vitamin D 250-125 Mg-unit Tabs (Calcium carbonate-vitamin d) .... Take 1 by mouth two times a day 11)  Vitamin B-12 1000 Mcg Tabs (Cyanocobalamin) .... Take 1 by mouth once daily 12)  Fish Oil 1000 Mg Caps (Omega-3 fatty acids) .... Take 1 by mouth two times a day 13)  Multivitamins Caps (Multiple vitamin) .... Take 1 by mouth once daily 14)  Albuterol Sulfate (2.5 Mg/53ml) 0.083% Nebu (Albuterol sulfate) .Marland Kitchen.. 1 vial via nebulizer up to four times daily as needed  Patient Instructions: 1)  Please schedule a follow-up appointment in 1 month.    Orders Added: 1)  Est. Patient Level IV [29562] 2)  Venipuncture [36415] 3)  TLB-Renal Function Panel [80069-RENAL] 4)  TLB-CBC Platelet - w/Differential [85025-CBCD]    Current Allergies (reviewed today): PREDNISONE (PREDNISONE) * CLINDAMYCIN CODEINE PHOSPHATE (CODEINE PHOSPHATE) TRAMADOL HCL (TRAMADOL HCL)

## 2010-03-20 ENCOUNTER — Encounter: Payer: Self-pay | Admitting: Internal Medicine

## 2010-03-22 ENCOUNTER — Ambulatory Visit (INDEPENDENT_AMBULATORY_CARE_PROVIDER_SITE_OTHER): Payer: Medicare Other

## 2010-03-22 ENCOUNTER — Encounter: Payer: Self-pay | Admitting: Internal Medicine

## 2010-03-22 DIAGNOSIS — Z7901 Long term (current) use of anticoagulants: Secondary | ICD-10-CM

## 2010-03-22 DIAGNOSIS — Z5181 Encounter for therapeutic drug level monitoring: Secondary | ICD-10-CM

## 2010-03-22 DIAGNOSIS — I4891 Unspecified atrial fibrillation: Secondary | ICD-10-CM

## 2010-03-22 LAB — CONVERTED CEMR LAB: INR: 2.5

## 2010-04-01 ENCOUNTER — Encounter: Payer: Self-pay | Admitting: Internal Medicine

## 2010-04-01 ENCOUNTER — Ambulatory Visit (INDEPENDENT_AMBULATORY_CARE_PROVIDER_SITE_OTHER): Payer: Medicare Other

## 2010-04-01 ENCOUNTER — Ambulatory Visit (INDEPENDENT_AMBULATORY_CARE_PROVIDER_SITE_OTHER): Payer: Medicare Other | Admitting: Internal Medicine

## 2010-04-01 DIAGNOSIS — Z5181 Encounter for therapeutic drug level monitoring: Secondary | ICD-10-CM

## 2010-04-01 DIAGNOSIS — I509 Heart failure, unspecified: Secondary | ICD-10-CM

## 2010-04-01 DIAGNOSIS — J449 Chronic obstructive pulmonary disease, unspecified: Secondary | ICD-10-CM

## 2010-04-01 DIAGNOSIS — G40309 Generalized idiopathic epilepsy and epileptic syndromes, not intractable, without status epilepticus: Secondary | ICD-10-CM

## 2010-04-01 DIAGNOSIS — I5032 Chronic diastolic (congestive) heart failure: Secondary | ICD-10-CM

## 2010-04-01 DIAGNOSIS — I4891 Unspecified atrial fibrillation: Secondary | ICD-10-CM

## 2010-04-01 DIAGNOSIS — F411 Generalized anxiety disorder: Secondary | ICD-10-CM

## 2010-04-01 DIAGNOSIS — Z7901 Long term (current) use of anticoagulants: Secondary | ICD-10-CM

## 2010-04-01 LAB — CONVERTED CEMR LAB
INR: 1.7
Prothrombin Time: 19.8 s

## 2010-04-04 LAB — CONVERTED CEMR LAB
Albumin: 4.1 g/dL (ref 3.5–5.2)
Calcium: 8.8 mg/dL (ref 8.4–10.5)
Creatinine, Ser: 1.08 mg/dL (ref 0.40–1.20)
Sodium: 137 meq/L (ref 135–145)

## 2010-04-06 NOTE — Medication Information (Signed)
Summary: protime/tw   Indication 1: Atrial fibrillation PT 19.8 INR RANGE 2.0-3.0           Allergies: 1)  Prednisone (Prednisone) 2)  * Clindamycin 3)  Codeine Phosphate (Codeine Phosphate) 4)  Tramadol Hcl (Tramadol Hcl)  Anticoagulation Management History:      Positive risk factors for bleeding include an age of 75 years or older.  The bleeding index is 'intermediate risk'.  Positive CHADS2 values include History of CHF, History of HTN, and Age > 75 years old.  Her last INR was 2.5 and today's INR is 1.7.  Prothrombin time is 19.8.    Anticoagulation Management Assessment/Plan:      The patient's current anticoagulation dose is Warfarin sodium 5 mg tabs: as directed, Warfarin sodium 7.5 mg tabs: as directed.  The next INR is due 3 weeks.        Laboratory Results   Blood Tests   Date/Time Recieved: April 01, 2010 2:42 PM  Date/Time Reported: April 01, 2010 2:42 PM   PT: 19.8 s   (Normal Range: 10.6-13.4)  INR: 1.7   (Normal Range: 0.88-1.12   Therap INR: 2.0-3.5)      ANTICOAGULATION RECORD PREVIOUS REGIMEN & LAB RESULTS Anticoagulation Diagnosis:  Atrial fibrillation on  03/08/2009 Previous INR Goal Range:  2.0-3.0 on  03/08/2009 Previous INR:  2.5 on  03/22/2010 Previous Coumadin Dose(mg):  5mg  daily on  03/01/2010 Previous Regimen:  5mg  qd, 2.5mg  T,TH,SAT on  03/22/2010 Previous Coagulation Comments:  held a total of 7.5mg  this past week per instructions. on  03/22/2010  NEW REGIMEN & LAB RESULTS Current INR: 1.7 Regimen: 5mg  qd, 2.5mg  t,th  Provider: Maloree Uplinger      Repeat testing in: 3 weeks MEDICATIONS ALPRAZOLAM 0.5 MG TABS (ALPRAZOLAM) Take 1 tablet by mouth three times a day as needed for anxiety FUROSEMIDE 40 MG  TABS (FUROSEMIDE) 1 tab daily TOPROL XL 25 MG  TB24 (METOPROLOL SUCCINATE) Take 1 tablet by mouth once a day IPRATROPIUM BROMIDE 0.02 % SOLN (IPRATROPIUM BROMIDE) 1 vial every 4 hours as needed for shortness of breath SPIRIVA  HANDIHALER 18 MCG CAPS (TIOTROPIUM BROMIDE MONOHYDRATE) inhale 1 capsule once daily WARFARIN SODIUM 5 MG TABS (WARFARIN SODIUM) as directed WARFARIN SODIUM 7.5 MG TABS (WARFARIN SODIUM) as directed KLOR-CON 10 10 MEQ CR-TABS (POTASSIUM CHLORIDE) take 1 by mouth once daily TRAZODONE HCL 50 MG TABS (TRAZODONE HCL) take 1 by mouth once daily at bedtime as needed CALCIUM-VITAMIN D 250-125 MG-UNIT TABS (CALCIUM CARBONATE-VITAMIN D) take 1 by mouth two times a day VITAMIN B-12 1000 MCG TABS (CYANOCOBALAMIN) take 1 by mouth once daily FISH OIL 1000 MG CAPS (OMEGA-3 FATTY ACIDS) take 1 by mouth two times a day MULTIVITAMINS  CAPS (MULTIPLE VITAMIN) take 1 by mouth once daily ALBUTEROL SULFATE (2.5 MG/3ML) 0.083% NEBU (ALBUTEROL SULFATE) 1 vial via nebulizer up to four times daily as needed  Dose has been reviewed with patient or caretaker during this visit.  Reviewed by: Allison Quarry  Anticoagulation Visit Questionnaire      Coumadin dose missed/changed:  No      Abnormal Bleeding Symptoms:  No Any diet changes including alcohol intake, vegetables or greens since the last visit:  No Any illnesses or hospitalizations since the last visit:  No Any signs of clotting since the last visit (including chest discomfort, dizziness, shortness of breath, arm tingling, slurred speech, swelling or redness in leg):  No

## 2010-04-06 NOTE — Assessment & Plan Note (Signed)
Summary: 1 MTH FU/TSC   Vital Signs:  Patient profile:   77 year old female Weight:      116 pounds O2 Sat:      96 % on 2-3liters Temp:     98.0 degrees F oral Pulse rate:   89 / minute Pulse rhythm:   regular BP sitting:   139 / 64  (left arm) Cuff size:   regular  Vitals Entered By: Mervin Hack CMA Duncan Dull) (April 01, 2010 2:07 PM)  O2 Flow:  2-3liters CC: follow-up   History of Present Illness: Here with daughter  as usual  No further seizures Wonders if it was due to the antibiotic--levaquin so I put it down as an allergy  Still at home children will bring food Cell phone and lifeline but no emergencies of late  Breathing is variable--good and bad no sig cough  Did have some left arm pain the other day ?some chest tightness as well at night with this Took 2 nitro and it helped No recent palpitations  Daughter notes that she gets shaky--esp after nebulizer (so hasn't been using this much) doesn't like spiriva--preferred the combivent  Just finishing with the PT and resp therapy at home  Allergies: 1)  ! Levaquin (Levofloxacin) 2)  Prednisone (Prednisone) 3)  Codeine Phosphate (Codeine Phosphate) 4)  * Clindamycin 5)  Tramadol Hcl (Tramadol Hcl)  Past History:  Past medical, surgical, family and social histories (including risk factors) reviewed for relevance to current acute and chronic problems.  Past Medical History: COPD  ?liver damage secondary to cholesterol medicine CHF--diastolic Anxiety Atrial fibrillation Colonic polyps, hx of Hypertension Osteoporosis Peripheral vascular disease Renal insufficiency Constipation Seizure 1/12  Past Surgical History: Reviewed history from 03/22/2007 and no changes required. Hysterectomy-partial Appendectomy Lumbar disc repair Detached retina Cataract removal Echo--mod AS EF-51%   3/07 DEXA - Osteoporosis Hosp-- Pneumonia with bilat effusions 04/2005 Carotid doppler  05/03/05 Barium  enema (-) 05/03/05 Bilateral cataract surgery 10/2005 BXx - colitis-- 6/05  Family History: Reviewed history from 05/11/2006 and no changes required. Father: DIED 79 LUNG CA Mother: DIED 45 PANCREATIC CA Siblings: 10 , HIGH CHOL, CRA CV: GRANDPARENTS +MI DM: SON BREAST CA: SISTER LUNG CA: Marta Antu [facility] [phone]  Social History: Reviewed history from 02/01/2007 and no changes required.  Married X 62 years then widowed Children: 4 children--2 daughter and 1 son living, 1 son suicide at age 31 Retired--[employer] Hobbies--flowers, reading, Mining engineer  and sewing Current Smoker  Review of Systems       Eats okay--- "like a bird" per daughter weight is up 3# sleeps well  Physical Exam  General:  alert.  NAD Neck:  supple, no masses, and no cervical lymphadenopathy.   Lungs:  normal respiratory effort, no intercostal retractions, and no accessory muscle use.  Decreased breath sounds but clear Heart:  normal rate, no murmur, no gallop, and irregular rhythm.   Abdomen:  soft and non-tender.   Extremities:  no edema Psych:  normally interactive, good eye contact, not anxious appearing, and not depressed appearing.     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 1 vial every  4 hours as needed for shortness of breath    Spiriva Handihaler 18 Mcg Caps (Tiotropium bromide monohydrate) ..... Inhale 1 capsule once daily Her updated medication list for this problem includes:    Albuterol Sulfate (2.5 Mg/37ml) 0.083% Nebu (Albuterol sulfate) .Marland Kitchen... 1 vial via nebulizer up to four times daily as needed     Combivent 18-103 Mcg/act Aero (Ipratropium-albuterol) .Marland Kitchen... 2 inhalations four times daily with spacer  Problem # 3:  ATRIAL FIBRILLATION (ICD-427.31) good rate control monitoring protimes  Her updated medication list for this problem includes:    Toprol Xl 25 Mg Tb24 (Metoprolol succinate) .Marland Kitchen... Take 1 tablet by mouth once a day    Warfarin Sodium 5 Mg Tabs (Warfarin sodium) .Marland Kitchen... As directed    Warfarin Sodium 7.5 Mg Tabs (Warfarin sodium) .Marland Kitchen... As directed  Problem # 4:  ANXIETY (ICD-300.00) Assessment: Improved nerves okay  Her updated medication list for this problem includes:    Alprazolam 0.5 Mg Tabs (Alprazolam) .Marland Kitchen... Take 1 tablet by mouth three times a day as needed for anxiety    Trazodone Hcl 50 Mg Tabs (Trazodone hcl) .Marland Kitchen... Take 1 by mouth once daily at bedtime as needed  Complete Medication List: 1)  Alprazolam 0.5 Mg Tabs (Alprazolam) .... Take 1 tablet by mouth three times a day as needed for anxiety 2)  Furosemide 40 Mg Tabs (Furosemide) .Marland Kitchen.. 1 tab daily 3)  Toprol Xl 25 Mg Tb24 (Metoprolol succinate) .... Take 1 tablet by mouth once a day 4)  Warfarin Sodium 5 Mg Tabs (Warfarin sodium) .... As directed 5)  Warfarin Sodium 7.5 Mg Tabs (Warfarin sodium) .... As directed 6)  Klor-con 10 10 Meq Cr-tabs (Potassium chloride) .... Take 1 by mouth once daily 7)  Trazodone Hcl 50 Mg Tabs (Trazodone hcl) .... Take 1 by mouth once daily at bedtime as needed 8)  Calcium-vitamin D 250-125 Mg-unit Tabs (Calcium carbonate-vitamin d) .... Take 1 by mouth two times a day 9)  Vitamin B-12 1000 Mcg Tabs (Cyanocobalamin) .... Take 1 by mouth once daily 10)  Fish Oil 1000 Mg Caps (Omega-3 fatty acids) .... Take 1 by mouth two times a day 11)  Multivitamins Caps (Multiple vitamin) .... Take 1 by mouth once daily 12)  Albuterol Sulfate (2.5 Mg/59ml) 0.083% Nebu (Albuterol sulfate) .Marland Kitchen.. 1 vial via nebulizer up to four times daily as needed 13)  Combivent 18-103 Mcg/act Aero  (Ipratropium-albuterol) .... 2 inhalations four times daily with spacer  Other Orders: Venipuncture (62130) Specimen Handling (86578) T-Renal Function Panel ([id number])  Patient Instructions: 1)  Please schedule a follow-up appointment in 4 months .  2)  Stop spiriva and go back to combivent four times daily. Be sure to use a spacer with this Prescriptions: COMBIVENT 18-103 MCG/ACT AERO (IPRATROPIUM-ALBUTEROL) 2 inhalations four times daily with spacer  #1 x 11   Entered and Authorized by:   Cindee Salt MD   Signed by:   Cindee Salt MD on 04/01/2010   Method used:   Electronically to        [facility]* (retail)       [address]       [place], [place]  [address]       Ph: [phone]       Fax: [phone]   RxID:   [id number]    Orders Added: 1)  Est. Patient Level IV [41660] 2)  Venipuncture [63016] 3)  Specimen Handling [99000] 4)  T-Renal Function Panel [[id number]]    Current Allergies (  reviewed today): ! LEVAQUIN (LEVOFLOXACIN) PREDNISONE (PREDNISONE) CODEINE PHOSPHATE (CODEINE PHOSPHATE) * CLINDAMYCIN TRAMADOL HCL (TRAMADOL HCL)

## 2010-04-06 NOTE — Miscellaneous (Signed)
Summary: [facility] MD Interim Order Report  [facility] MD Interim Order Report   Imported By: Kassie Mends 03/29/2010 09:17:01  _____________________________________________________________________  External Attachment:    Type:   Image     Comment:   External Document

## 2010-04-27 ENCOUNTER — Encounter: Payer: Self-pay | Admitting: Internal Medicine

## 2010-04-27 ENCOUNTER — Ambulatory Visit (INDEPENDENT_AMBULATORY_CARE_PROVIDER_SITE_OTHER): Payer: Medicare Other

## 2010-04-27 DIAGNOSIS — Z7901 Long term (current) use of anticoagulants: Secondary | ICD-10-CM

## 2010-04-27 DIAGNOSIS — Z5181 Encounter for therapeutic drug level monitoring: Secondary | ICD-10-CM

## 2010-04-27 DIAGNOSIS — I4891 Unspecified atrial fibrillation: Secondary | ICD-10-CM

## 2010-04-27 LAB — CONVERTED CEMR LAB
INR: 2.4
Prothrombin Time: 29.3 s

## 2010-04-28 ENCOUNTER — Ambulatory Visit: Payer: Self-pay | Admitting: Internal Medicine

## 2010-04-28 ENCOUNTER — Ambulatory Visit: Payer: PRIVATE HEALTH INSURANCE

## 2010-05-03 NOTE — Medication Information (Signed)
Summary: protime/tw   Indication 1: Atrial fibrillation PT 29.3 INR RANGE 2.0-3.0           Allergies: 1)  ! Levaquin (Levofloxacin) 2)  Prednisone (Prednisone) 3)  Codeine Phosphate (Codeine Phosphate) 4)  * Clindamycin 5)  Tramadol Hcl (Tramadol Hcl)  Anticoagulation Management History:      Positive risk factors for bleeding include an age of 75 years or older.  The bleeding index is 'intermediate risk'.  Positive CHADS2 values include History of CHF, History of HTN, and Age > 75 years old.  Her last INR was 1.7 and today's INR is 2.4.  Prothrombin time is 29.3.    Anticoagulation Management Assessment/Plan:      The patient's current anticoagulation dose is Warfarin sodium 5 mg tabs: as directed, Warfarin sodium 7.5 mg tabs: as directed.  The next INR is due 4 weeks .        Laboratory Results   Blood Tests   Date/Time Received: April 27, 2010 2:44 PM Date/Time Reported: April 27, 2010 2:45 PM   PT: 29.3 s   (Normal Range: 10.6-13.4)  INR: 2.4   (Normal Range: 0.88-1.12   Therap INR: 2.0-3.5)      ANTICOAGULATION RECORD PREVIOUS REGIMEN & LAB RESULTS Anticoagulation Diagnosis:  Atrial fibrillation on  03/08/2009 Previous INR Goal Range:  2.0-3.0 on  03/08/2009 Previous INR:  1.7 on  04/01/2010 Previous Coumadin Dose(mg):  5mg  daily on  03/01/2010 Previous Regimen:  5mg  qd, 2.5mg  t,th on  04/01/2010 Previous Coagulation Comments:  held a total of 7.5mg  this past week per instructions. on  03/22/2010  NEW REGIMEN & LAB RESULTS Current INR: 2.4 Regimen: 5mg  qd, 2.5mg  t,th  (no change)  Provider: Alphonsus Sias Repeat testing in: 4 weeks  Dose has been reviewed with patient or caretaker during this visit. Reviewed by: Morrie Sheldon  Anticoagulation Visit Questionnaire Coumadin dose missed/changed:  No Abnormal Bleeding Symptoms:  No  Any diet changes including alcohol intake, vegetables or greens since the last visit:  No Any illnesses or hospitalizations since the  last visit:  No Any signs of clotting since the last visit (including chest discomfort, dizziness, shortness of breath, arm tingling, slurred speech, swelling or redness in leg):  No  MEDICATIONS ALPRAZOLAM 0.5 MG TABS (ALPRAZOLAM) Take 1 tablet by mouth three times a day as needed for anxiety FUROSEMIDE 40 MG  TABS (FUROSEMIDE) 1 tab daily TOPROL XL 25 MG  TB24 (METOPROLOL SUCCINATE) Take 1 tablet by mouth once a day WARFARIN SODIUM 5 MG TABS (WARFARIN SODIUM) as directed WARFARIN SODIUM 7.5 MG TABS (WARFARIN SODIUM) as directed KLOR-CON 10 10 MEQ CR-TABS (POTASSIUM CHLORIDE) take 1 by mouth once daily TRAZODONE HCL 50 MG TABS (TRAZODONE HCL) take 1 by mouth once daily at bedtime as needed CALCIUM-VITAMIN D 250-125 MG-UNIT TABS (CALCIUM CARBONATE-VITAMIN D) take 1 by mouth two times a day VITAMIN B-12 1000 MCG TABS (CYANOCOBALAMIN) take 1 by mouth once daily FISH OIL 1000 MG CAPS (OMEGA-3 FATTY ACIDS) take 1 by mouth two times a day MULTIVITAMINS  CAPS (MULTIPLE VITAMIN) take 1 by mouth once daily ALBUTEROL SULFATE (2.5 MG/3ML) 0.083% NEBU (ALBUTEROL SULFATE) 1 vial via nebulizer up to four times daily as needed COMBIVENT 18-103 MCG/ACT AERO (IPRATROPIUM-ALBUTEROL) 2 inhalations four times daily with spacer

## 2010-05-04 ENCOUNTER — Ambulatory Visit (INDEPENDENT_AMBULATORY_CARE_PROVIDER_SITE_OTHER): Payer: Medicare Other | Admitting: Internal Medicine

## 2010-05-04 ENCOUNTER — Ambulatory Visit: Payer: Medicare Other | Admitting: Internal Medicine

## 2010-05-04 ENCOUNTER — Encounter: Payer: Self-pay | Admitting: Internal Medicine

## 2010-05-04 VITALS — BP 134/58 | HR 86 | Temp 97.7°F | Resp 20 | Wt 116.8 lb

## 2010-05-04 DIAGNOSIS — J209 Acute bronchitis, unspecified: Secondary | ICD-10-CM

## 2010-05-04 MED ORDER — POTASSIUM CHLORIDE 10 MEQ PO TBCR: 10.0000 meq | EXTENDED_RELEASE_TABLET | Freq: Every day | ORAL | Status: DC

## 2010-05-04 MED ORDER — AMOXICILLIN-POT CLAVULANATE 875-125 MG PO TABS: 1.0000 | ORAL_TABLET | Freq: Two times a day (BID) | ORAL | Status: AC

## 2010-05-04 NOTE — Patient Instructions (Signed)
Please call if you are not improving by next week. Please keep your regular appointment

## 2010-05-04 NOTE — Progress Notes (Signed)
  Subjective:    Patient ID: [name] [name], female    DOB: 1920/02/17, 77 y.o.   MRN: [id number]  HPI Here with daughter Has had some increased SOB over the past week Exercise tolerance is worsened---even brief personal care like brushing teeth  No clear fever Chronic cough--now with green mucus No sore throat No ear pain  Past Medical History  Diagnosis Date  . Anxiety   . Hypertension   . Osteoporosis   . COPD (chronic obstructive pulmonary disease)   . CHF (congestive heart failure)     diastolic  . Atrial fibrillation   . History of colon polyps   . Peripheral vascular disease   . Renal insufficiency   . Constipation   . Seizure     Past Surgical History  Procedure Date  . Abdominal hysterectomy   . Appendectomy   . Lumbar disc surgery     repair  . Retinal detachment surgery   . Cataract extraction     Family History  Problem Relation Age of Onset  . Cancer Mother   . Cancer Father   . Cancer Sister     breast cancer  . Diabetes Son   . Cancer Sister     lung cancer    History   Social History  . Marital Status: Widowed    Spouse Name: N/A    Number of Children: 4  . Years of Education: N/A   Occupational History  . retired    Social History Main Topics  . Smoking status: Former Smoker    Types: Cigarettes  . Smokeless tobacco: Not on file  . Alcohol Use: Not on file  . Drug Use: Not on file  . Sexually Active: Not on file   Other Topics Concern  . Not on file   Social History Narrative   Hobbies: flowers, reading, genecology and sewing     Review of Systems Got stronger in legs with the PT--but this finished  Now starting to feel weaker Appetite isn't great--no recent changes No N/V/diarrhea    Objective:   Physical Exam  Constitutional: She appears well-developed. No distress.  HENT:  Right Ear: External ear normal.  Left Ear: External ear normal.  Mouth/Throat: Oropharynx is clear and moist.       Mild nasal  irritation May have small polyp on the right  Neck: Neck supple.  Pulmonary/Chest: Effort normal. No respiratory distress. She has no wheezes. She has no rales.       Little air movement but no adventitious sounds  Lymphadenopathy:    She has no cervical adenopathy.          Assessment & Plan:

## 2010-05-06 ENCOUNTER — Telehealth: Payer: Self-pay | Admitting: Radiology

## 2010-05-06 DIAGNOSIS — Z5181 Encounter for therapeutic drug level monitoring: Secondary | ICD-10-CM

## 2010-05-06 DIAGNOSIS — Z7901 Long term (current) use of anticoagulants: Secondary | ICD-10-CM

## 2010-05-06 DIAGNOSIS — I4891 Unspecified atrial fibrillation: Secondary | ICD-10-CM

## 2010-05-06 NOTE — Telephone Encounter (Signed)
Order for INR

## 2010-05-11 ENCOUNTER — Other Ambulatory Visit: Payer: Self-pay | Admitting: Internal Medicine

## 2010-05-11 ENCOUNTER — Telehealth (INDEPENDENT_AMBULATORY_CARE_PROVIDER_SITE_OTHER): Payer: Medicare Other | Admitting: *Deleted

## 2010-05-11 DIAGNOSIS — J449 Chronic obstructive pulmonary disease, unspecified: Secondary | ICD-10-CM

## 2010-05-11 MED ORDER — LEVALBUTEROL HCL 0.31 MG/3ML IN NEBU: 0.3100 mg | INHALATION_SOLUTION | RESPIRATORY_TRACT | Status: DC | PRN

## 2010-05-11 NOTE — Progress Notes (Signed)
Daughter in for her visit Patient won't use albuterol nebs due to shaking  Will try xopenex instead

## 2010-05-11 NOTE — Telephone Encounter (Signed)
rx corrected and re-sent to [facility] pharmacy.

## 2010-05-11 NOTE — Telephone Encounter (Signed)
[facility] is asking for clarification on xopenex  Script that was sent in today.  They are asking for a quantity, such as a months supply.

## 2010-05-11 NOTE — Telephone Encounter (Signed)
Yes, the computer was funny about that A month supply with 3 refills would be right

## 2010-05-17 ENCOUNTER — Telehealth: Payer: Self-pay | Admitting: *Deleted

## 2010-05-17 NOTE — Telephone Encounter (Signed)
Form done. Please fax.

## 2010-05-17 NOTE — Telephone Encounter (Signed)
Form faxed, copied and scanned

## 2010-05-17 NOTE — Telephone Encounter (Signed)
Prior Berkley Harvey is needed for xopenex, form is on your desk.

## 2010-05-24 ENCOUNTER — Ambulatory Visit (INDEPENDENT_AMBULATORY_CARE_PROVIDER_SITE_OTHER): Payer: Medicare Other | Admitting: Family Medicine

## 2010-05-24 DIAGNOSIS — Z7901 Long term (current) use of anticoagulants: Secondary | ICD-10-CM

## 2010-05-24 DIAGNOSIS — Z5181 Encounter for therapeutic drug level monitoring: Secondary | ICD-10-CM

## 2010-05-24 DIAGNOSIS — I4891 Unspecified atrial fibrillation: Secondary | ICD-10-CM

## 2010-05-24 LAB — POCT INR: INR: 2.4

## 2010-05-24 NOTE — Patient Instructions (Signed)
Continue current dose, check in 4 weeks  

## 2010-05-25 ENCOUNTER — Other Ambulatory Visit: Payer: Self-pay | Admitting: *Deleted

## 2010-05-25 DIAGNOSIS — J4489 Other specified chronic obstructive pulmonary disease: Secondary | ICD-10-CM

## 2010-05-25 DIAGNOSIS — J449 Chronic obstructive pulmonary disease, unspecified: Secondary | ICD-10-CM

## 2010-05-25 MED ORDER — LEVALBUTEROL HCL 0.31 MG/3ML IN NEBU: 0.3100 mg | INHALATION_SOLUTION | RESPIRATORY_TRACT | Status: DC | PRN

## 2010-05-25 NOTE — Telephone Encounter (Signed)
New rx faxed to [facility]

## 2010-06-22 ENCOUNTER — Ambulatory Visit (INDEPENDENT_AMBULATORY_CARE_PROVIDER_SITE_OTHER): Payer: Medicare Other | Admitting: Internal Medicine

## 2010-06-22 DIAGNOSIS — I4891 Unspecified atrial fibrillation: Secondary | ICD-10-CM

## 2010-06-22 DIAGNOSIS — Z5181 Encounter for therapeutic drug level monitoring: Secondary | ICD-10-CM

## 2010-06-22 DIAGNOSIS — Z7901 Long term (current) use of anticoagulants: Secondary | ICD-10-CM

## 2010-06-22 LAB — POCT INR: INR: 2.9

## 2010-07-01 NOTE — Assessment & Plan Note (Signed)
[facility]                             [place] OFFICE NOTE   NAME:[name], [name]                          MRN:          [id number]  DATE:09/13/2005                            DOB:          May 21, 1920    NEW PATIENT HISTORY AND PHYSICAL:   CHIEF COMPLAINT:  An 77 year old white female here to establish a new  doctor.   HISTORY OF PRESENT ILLNESS:  [name] [name] comes to establish a new doctor  because she was very unhappy with the bedside manner of her recent  physician, [name] [name].  She had been with him for approximately 10 years  prior.  She has several things she would like to begin to discuss today,  including her long past medical history:   1.  Recent hospitalization in May for pneumonia, CHF, COPD exacerbation.      During this hospitalization she received a new diagnosis of congestive      heart failure.  She had also been feeling some irregular heart beat and      states that she was placed on warfarin at that time.  She now has some      questions about the warfarin medication.  [name] [name] had wanted to      change her over to brand name Coumadin, but she cannot afford this.  It      is not clear as to what the indication for warfarin is, although most      likely it is atrial fibrillation.  She denies any chest pain, bleeding,      fever or chills.  2.  Blood in stool:  They had noted some blood in her stool while she was in      the hospital.  She states that at that time they stated it was secondary      to being on Coumadin with a supertherapeutic level, but she is unsure.      She did have a colonoscopy in 2005 but is unsure of what these results      were.  3.  Chronic low back pain:  At this point in time she experiences daily low      back pain.  She has a history of back surgery with disk fusion.  She      also has a history of osteoporosis.  The low back pain is intermittent      and moderate in intensity.  It increases  with movement.  She states that      her lower extremities have always been somewhat weak since taking Bextra      many years ago and is having muscle and liver damage from it.   PAST MEDICAL HISTORY:  1.  Congestive heart failure.  2.  COPD/emphysema.  3.  Hypertension.  4.  Hypercholesterolemia.  5.  Arrhythmia, likely atrial fibrillation.  6.  Tobacco abuse.  7.  Liver damage secondary to cholesterol medication, possibly resolved.  8.  Osteoporosis.  9.  Chronic low back pain.   HOSPITALIZATIONS SURGERIES AND PROCEDURES:  1.  Hysterectomy,  partial.  2.  Appendectomy.  3.  Back surgery with vertebral fusion.  4.  Detached retina.  5.  Cataract removal.  6.  Echocardiogram.  7.  DEXA scan.  8.  Colonoscopy in 2005.   ALLERGIES:  CODEINE.   MEDICATIONS:  1.  Multivitamin daily.  2.  Citracal 250 mg plus vitamin D two twice a day.  3.  Spiriva q.a.m.  4.  Combivent t.i.d. to q.i.d.  5.  Amlodipine/benazepril 5/10 mg daily.  6.  Simvastatin 80 mg daily.  7.  Furosemide 40 mg p.o. b.i.d.  8.  Metoprolol 25 mg daily.  9.  Warfarin 4 mg daily.  10. Oxygen 2 L, intermittent.  11. Compression hose.  12. Ventolin p.r.n.  13. Alprazolam 0.5 mg p.r.n.  14. NitroQuick 0.4 mg sublingual p.r.n. chest pain.   REVIEW OF SYSTEMS:  No recent headaches or syncope.  She wears glasses.  No  hearing problems.  She wears dentures.  No recent increase in shortness of  breath.  She denies chest pain.  No nausea, vomiting, diarrhea or  constipation.  No nocturia or urinary frequency.   FAMILY HISTORY:  Father deceased at age 54 with lung cancer.  Mother  deceased at age 66 with pancreatic cancer.  Her grandparents had  cardiovascular disease and one of them had a heart attack.  One of her sons  has diabetes.  She has a sister with breast cancer.  There is no family  history of prostate cancer or colon cancer.  There is no family history of  depression, alcohol and drug abuse.  One of her  sisters did also have lung  cancer.  She has a total of 10 siblings with one of them having a history of  high cholesterol and stroke.   SOCIAL HISTORY:  She is a retired Financial controller for [employer].  She has  lived in the [place] area for over 70 years.  She currently lives on her  own in a house.  She drives a car and does her own ADLs.  She does have some  assistance with cleaning the house.  For activity she gardens but gets  minimal exercise secondary to chronic leg weakness.  She has been widowed  but was married for 62 years.  She has four children, one son who has passed  away from suicide at age 79 but otherwise two daughters and one son.  Her  closest relative is her daughter, [name] [name], who is joint health care  power of attorney with the other daughter.  [name] [name] lives approximately 20  minutes away.  Her phone number is [phone].   PHYSICAL EXAMINATION:  VITAL SIGNS:  Height 62-1/4 inches.  Weight 116.  Blood pressure 112/58, pulse 72, temperature 98.  GENERAL:  Elderly, cachectic-appearing female with kyphosis, in no apparent  distress.  HEENT:  PERRLA, extraocular muscles intact.  Oropharynx clear.  Nares  patent.  NECK:  No lymphadenopathy, no thyromegaly.  CARDIOVASCULAR:  Irregular rate, no murmurs, rubs or gallops.  PULMONARY:  Barrel chest, decreased breath sounds throughout secondary to  decreased air movement.  No wheezes, rales or rhonchi.  No cyanosis.  EXTREMITIES:  No peripheral edema.  Pulses 2+ pedal and radial.   ASSESSMENT AND PLAN:  1.  Chronic obstructive pulmonary disease.  2.  Atrial fibrillation.  3.  Hypertension.   Given [name] [name]'s complicated past medical history, I would like to obtain  medical records from her recent hospitalization as well as from  her previous  physician.  Once these are obtained, I will review them and she will return for an office visit.  In the meantime, because she is almost out of her  warfarin, we will  have her do an INR and PT today.  We will aim our goal for  INR to be between 2 and 3.  I will refill her warfarin and adjust the dose  accordingly.  Currently her COPD and emphysema and seem stable and she will  continue on her current medications.  Her hypertension is well-controlled  and there is no evidence of CHF exacerbation today.  When she follows up  with me, we will need to address the blood that was in her stool as well as  her chronic low back pain.                                   Kerby Nora, MD   AB/MedQ  DD:  09/14/2005  DT:  09/14/2005  Job #:  [id number]

## 2010-07-20 ENCOUNTER — Ambulatory Visit (INDEPENDENT_AMBULATORY_CARE_PROVIDER_SITE_OTHER): Payer: Medicare Other | Admitting: Internal Medicine

## 2010-07-20 DIAGNOSIS — Z7901 Long term (current) use of anticoagulants: Secondary | ICD-10-CM

## 2010-07-20 DIAGNOSIS — I4891 Unspecified atrial fibrillation: Secondary | ICD-10-CM

## 2010-07-20 DIAGNOSIS — Z5181 Encounter for therapeutic drug level monitoring: Secondary | ICD-10-CM

## 2010-07-20 NOTE — Patient Instructions (Signed)
Continue current dose, check in 4 weeks  

## 2010-08-01 ENCOUNTER — Telehealth: Payer: Self-pay | Admitting: *Deleted

## 2010-08-01 NOTE — Telephone Encounter (Signed)
Triage Record Num: [id number] Operator: Tomasita Crumble Patient Name: Stepfanie Yott Call Date & Time: 07/30/2010 11:13:50AM Patient Phone: [phone] PCP: Tillman Abide Patient Gender: Female PCP Fax : [phone] Patient DOB: 12-04-1920 Practice Name: [facility] Reason for Call: [name], Other, calling regarding . PCP is Alphonsus Sias ([facility]), Garnetta Buddy number is [phone]. Caller states he mom c/o having to go to [place] frequently x 2 days, now has burning with urination. Afebrile/subjective. Per Urnary Sx. protocol advised see in 24 hours. Caller requested Rx; declined due to sx and poor history, unknown med allergy/ intolerance. Caller states she will take her to UC near [facility]/ [facility]. Protocol(s) Used: Urinary Symptoms - Female Recommended Outcome per Protocol: See Provider within 24 hours Reason for Outcome: Has one or more urinary tract symptoms Care Advice: Limit carbonated, alcoholic, and caffeinated beverages such as coffee, tea and soda. Avoid nonprescription cold and allergy medications that contain caffeine. Limit intake of tomatoes, fruit juices (except for unsweetened cranberry juice), dairy products, spicy foods, sugar, and artificial sweeteners (aspartame or saccharine). Stop or decrease smoking. Reducing exposure to bladder irritants may help lessen urgency. ~ Analgesic/Antipyretic Advice - Acetaminophen: Consider acetaminophen as directed on label or by pharmacist/provider for pain or fever PRECAUTIONS: - Use if there is no history of liver disease, alcoholism, or intake of three or more alcohol drinks per day - Only if approved by provider during pregnancy or when breastfeeding - During pregnancy, acetaminophen should not be taken more than 3 consecutive days without telling provider - Do not exceed recommended dose or frequency ~ Systemic Inflammatory Response Syndrome (SIRS): Watch for signs of a generalized, whole body infection.  Occurs within days of a localized infection, especially of the urinary, GI, respiratory or nervous systems; or after a traumatic injury or invasive procedure. - Call EMS 911 if symptoms have worsened, such as increasing confusion or unusual drowsiness; cold and clammy skin; no urine output; rapid respiration (>30/min.) or slow respiration (<10/min.); struggling to breathe. - Go to the ED immediately for early symptoms of rapid pulse >90/min. or rapid breathing >20/min. at rest; chills; oral temperature >100.4 F (38 C) or <96.8 F (36 C) when associated with conditions noted. ~ Analgesic/Antipyretic Advice - NSAIDs: Consider aspirin, ibuprofen, naproxen or ketoprofen for pain or fever as directed on label or by pharmacist/provider. PRECAUTIONS: - If over 54 years of age, should not take longer than 1 week without consulting provider. EXCEPTIONS: - Should not be used if taking blood thinners or have bleeding problems. - Do not use if have history of sensitivity/allergy to any of these medications; or history of cardiovascular, ulcer, kidney, liver disease or diabetes unless approved by provider. - Do not exceed recommended dose or frequency. ~ 06/

## 2010-08-01 NOTE — Telephone Encounter (Signed)
Please check on her

## 2010-08-01 NOTE — Telephone Encounter (Signed)
Tried calling patient to see how she's doing and the phone stated " the mailbox is full and not enough space to leave a message" I also called the daughter and she told me to call her mother. Will try again later.

## 2010-08-03 ENCOUNTER — Encounter: Payer: Self-pay | Admitting: Internal Medicine

## 2010-08-03 NOTE — Telephone Encounter (Signed)
Patient has appt 08/04/10

## 2010-08-04 ENCOUNTER — Ambulatory Visit (INDEPENDENT_AMBULATORY_CARE_PROVIDER_SITE_OTHER): Payer: Medicare Other | Admitting: Internal Medicine

## 2010-08-04 ENCOUNTER — Encounter: Payer: Self-pay | Admitting: Internal Medicine

## 2010-08-04 VITALS — BP 128/70 | HR 80 | Temp 97.6°F | Ht 62.0 in | Wt 119.0 lb

## 2010-08-04 DIAGNOSIS — J4489 Other specified chronic obstructive pulmonary disease: Secondary | ICD-10-CM

## 2010-08-04 DIAGNOSIS — I5032 Chronic diastolic (congestive) heart failure: Secondary | ICD-10-CM

## 2010-08-04 DIAGNOSIS — I509 Heart failure, unspecified: Secondary | ICD-10-CM

## 2010-08-04 DIAGNOSIS — I4891 Unspecified atrial fibrillation: Secondary | ICD-10-CM

## 2010-08-04 DIAGNOSIS — I10 Essential (primary) hypertension: Secondary | ICD-10-CM

## 2010-08-04 DIAGNOSIS — J449 Chronic obstructive pulmonary disease, unspecified: Secondary | ICD-10-CM

## 2010-08-04 DIAGNOSIS — F411 Generalized anxiety disorder: Secondary | ICD-10-CM

## 2010-08-04 DIAGNOSIS — R7309 Other abnormal glucose: Secondary | ICD-10-CM

## 2010-08-04 DIAGNOSIS — R739 Hyperglycemia, unspecified: Secondary | ICD-10-CM

## 2010-08-04 DIAGNOSIS — D638 Anemia in other chronic diseases classified elsewhere: Secondary | ICD-10-CM

## 2010-08-04 MED ORDER — FUROSEMIDE 40 MG PO TABS: 40.0000 mg | ORAL_TABLET | Freq: Every day | ORAL | Status: DC

## 2010-08-04 NOTE — Assessment & Plan Note (Signed)
BP Readings from Last 3 Encounters:  08/04/10 128/70  05/04/10 134/58  04/01/10 139/64   Good control

## 2010-08-04 NOTE — Assessment & Plan Note (Signed)
Does fine with the xanax and trazodone for sleep

## 2010-08-04 NOTE — Assessment & Plan Note (Signed)
Some cough but no clear infection Slight improvement in functional status of late

## 2010-08-04 NOTE — Progress Notes (Signed)
Subjective:    Patient ID: [name] [name], female    DOB: [birth year], 77 y.o.   MRN: [id number]  HPI Here with daughter as usual  Did get seen at urgent care this weekend Started on antibiotic--macrodantin.  Urinary symptoms are better  Seems to be doing better over the past month or so More stamina-- still gets dyspneic easy Has house cleaner every 2 weeks Family brings in food--she rarely cooks She does dishes, occ laundry, all personal care  No chest pain No palpitations Some cough--some sputum No fever Breathing is at her baseline Chronic mild left foot edema  Anxiety still present Does okay in general though  Current Outpatient Prescriptions on File Prior to Visit  Medication Sig Dispense Refill  . albuterol-ipratropium (COMBIVENT) 18-103 MCG/ACT inhaler Inhale 2 puffs into the lungs every 6 (six) hours as needed.        . ALPRAZolam (XANAX) 0.5 MG tablet Take 0.5 mg by mouth at bedtime as needed.        . calcium-vitamin D (OSCAL) 250-125 MG-UNIT per tablet Take 1 tablet by mouth 2 (two) times daily.        . fish oil-omega-3 fatty acids 1000 MG capsule Take 2 g by mouth daily.        Marland Kitchen levalbuterol (XOPENEX) 0.31 MG/3ML nebulizer solution Take 3 mLs (0.31 mg total) by nebulization every 4 (four) hours as needed for wheezing.  46 mL  3  . metoprolol succinate (TOPROL-XL) 25 MG 24 hr tablet Take 25 mg by mouth daily.        . Multiple Vitamin (MULTIVITAMIN) capsule Take 1 capsule by mouth daily.        . potassium chloride (KLOR-CON) 10 MEQ CR tablet Take 1 tablet (10 mEq total) by mouth daily.  30 tablet  11  . traZODone (DESYREL) 50 MG tablet Take 50 mg by mouth at bedtime.        . vitamin B-12 (CYANOCOBALAMIN) 1000 MCG tablet Take 1,000 mcg by mouth daily.        Marland Kitchen warfarin (COUMADIN) 5 MG tablet Take 5 mg by mouth daily.        Marland Kitchen warfarin (COUMADIN) 7.5 MG tablet Take 7.5 mg by mouth daily.        Marland Kitchen DISCONTD: furosemide (LASIX) 40 MG tablet Take 40 mg by mouth daily.          Past Medical History  Diagnosis Date  . Anxiety   . Hypertension   . Osteoporosis   . COPD (chronic obstructive pulmonary disease)   . CHF (congestive heart failure)     diastolic  . Atrial fibrillation   . History of colon polyps   . Peripheral vascular disease   . Renal insufficiency   . Constipation   . Seizure     Past Surgical History  Procedure Date  . Abdominal hysterectomy   . Appendectomy   . Lumbar disc surgery     repair  . Retinal detachment surgery   . Cataract extraction     Family History  Problem Relation Age of Onset  . Cancer Mother   . Cancer Father   . Cancer Sister     breast cancer  . Diabetes Son   . Cancer Sister     lung cancer    History   Social History  . Marital Status: Widowed    Spouse Name: N/A    Number of Children: 4  . Years of Education: N/A   Occupational History  .  retired     Educational psychologist industries   Social History Main Topics  . Smoking status: Former Smoker    Types: Cigarettes  . Smokeless tobacco: Not on file  . Alcohol Use: Not on file  . Drug Use: Not on file  . Sexually Active: Not on file   Other Topics Concern  . Not on file   Social History Narrative   Hobbies: flowers, reading, genecology and sewing   Review of Systems Appetite still not very good-no change Tends to eat multiple small amounts Some trouble with dentures--dealing with dentist in past week. Had some sore places in mouth--better Sleeps fair but occ early awakening and can't go back Weight is up some     Objective:   Physical Exam  Constitutional: She appears well-developed and well-nourished. No distress.  Neck: Normal range of motion. Neck supple. No thyromegaly present.  Cardiovascular: Normal rate and normal heart sounds.  Exam reveals no gallop.   No murmur heard.      irregular  Pulmonary/Chest: Effort normal. No respiratory distress. She has no wheezes. She has no rales.       Decreased breath sounds but  clear Normal exp phase  Abdominal: Soft. There is no tenderness.  Musculoskeletal: Normal range of motion. She exhibits no edema and no tenderness.  Lymphadenopathy:    She has no cervical adenopathy.  Psychiatric: She has a normal mood and affect. Her behavior is normal. Judgment and thought content normal.          Assessment & Plan:

## 2010-08-04 NOTE — Assessment & Plan Note (Signed)
Compensated Weight up some but not fluid

## 2010-08-04 NOTE — Assessment & Plan Note (Signed)
Good rate control Still on coumadin 

## 2010-08-05 LAB — CBC WITH DIFFERENTIAL/PLATELET
Basophils Relative: 0.7 % (ref 0.0–3.0)
Eosinophils Relative: 1.9 % (ref 0.0–5.0)
HCT: 35.9 % — ABNORMAL LOW (ref 36.0–46.0)
Lymphs Abs: 1.7 10*3/uL (ref 0.7–4.0)
MCV: 79.3 fl (ref 78.0–100.0)
Monocytes Absolute: 0.7 10*3/uL (ref 0.1–1.0)
Monocytes Relative: 9 % (ref 3.0–12.0)
Neutrophils Relative %: 65.9 % (ref 43.0–77.0)
RBC: 4.53 Mil/uL (ref 3.87–5.11)
WBC: 7.6 10*3/uL (ref 4.5–10.5)

## 2010-08-05 LAB — BASIC METABOLIC PANEL
BUN: 27 mg/dL — ABNORMAL HIGH (ref 6–23)
GFR: 53.19 mL/min — ABNORMAL LOW (ref >60.00)
Glucose, Bld: 101 mg/dL — ABNORMAL HIGH (ref 70–99)
Potassium: 4.2 mEq/L (ref 3.5–5.1)

## 2010-08-05 LAB — HEPATIC FUNCTION PANEL
ALT: 17 U/L (ref 0–35)
AST: 30 U/L (ref 0–37)
Alkaline Phosphatase: 71 U/L (ref 39–117)
Total Bilirubin: 0.2 mg/dL — ABNORMAL LOW (ref 0.3–1.2)

## 2010-08-10 ENCOUNTER — Encounter: Payer: Self-pay | Admitting: *Deleted

## 2010-08-12 ENCOUNTER — Encounter: Payer: Self-pay | Admitting: Family Medicine

## 2010-08-12 ENCOUNTER — Ambulatory Visit (INDEPENDENT_AMBULATORY_CARE_PROVIDER_SITE_OTHER): Payer: Medicare Other | Admitting: Family Medicine

## 2010-08-12 VITALS — BP 144/70 | HR 76 | Temp 97.5°F

## 2010-08-12 DIAGNOSIS — R42 Dizziness and giddiness: Secondary | ICD-10-CM | POA: Insufficient documentation

## 2010-08-12 DIAGNOSIS — R300 Dysuria: Secondary | ICD-10-CM | POA: Insufficient documentation

## 2010-08-12 LAB — POCT URINALYSIS DIPSTICK
Blood, UA: NEGATIVE
Ketones, UA: NEGATIVE
Leukocytes, UA: NEGATIVE
Protein, UA: NEGATIVE
Spec Grav, UA: 1.005
pH, UA: 6.5

## 2010-08-12 NOTE — Progress Notes (Signed)
Was seen 13 days ago at walk in clinic and was put on abx for UTI.  Started on macrodantin and azo.  Her sx improved in the meantime.  Now again with concern for UTI.  Urine dip today was unremarkable.  She had some pressure with urination last night, but not today.  Burning with urination has resolved.    No presyncope/falls recently but she feels "dizzy" with some mild sensation of room spinning.  She feels somewhat lightheaded but his isn't consistent.  Sx can happen with head turning, rolling over in bed.  This has happened before.     On O2 and in w/c at baseline.  She has had falls before.    Meds, vitals, and allergies reviewed.   ROS: See HPI.  Otherwise, noncontributory.  NAD in w/c on O2 ncat Tm wnl perrl and eomi Neck supple rrr ctab Cn grossly intact and motor testing grossly wnl x4.  No vertigo sx with eye tracking but + with head turning

## 2010-08-12 NOTE — Patient Instructions (Signed)
We'll contact you with your lab report. I think it is reasonable to see if the vertigo resolves on its own.  If it continues, let me know.  Take care.

## 2010-08-14 NOTE — Assessment & Plan Note (Signed)
I doubt this is UTI related.  I would observe this for now and check UCX in meantime, treat if positive.  She may just have BPV.  She agrees, no new meds today.

## 2010-08-14 NOTE — Assessment & Plan Note (Signed)
Pt didn't want to start abx with her h/o coumadin and I think this is reasonable.  Check ucx, treat if pos, and she'll notify me/clinic of continued sx.  She agrees with plan, as does family her with patient today.  Okay for outpatient fu, nontoxic.

## 2010-08-16 ENCOUNTER — Telehealth: Payer: Self-pay | Admitting: *Deleted

## 2010-08-16 NOTE — Telephone Encounter (Signed)
Spoke with both daughters, they are concerned about there mother's care and how much they can care for her, they have questions about maybe a home health nurse or maybe starting home visits? Per daughters the patient gets so worn out having to come in for visits or to go outside the house. They are concerned they are not taking good care of her. I added her on Thursday at 1:30 both daughters will be here.

## 2010-08-17 NOTE — Telephone Encounter (Signed)
Will discuss putting her on my home list and considering home health options

## 2010-08-18 ENCOUNTER — Ambulatory Visit (INDEPENDENT_AMBULATORY_CARE_PROVIDER_SITE_OTHER): Payer: Medicare Other | Admitting: Internal Medicine

## 2010-08-18 ENCOUNTER — Ambulatory Visit: Payer: Medicare Other | Admitting: Family Medicine

## 2010-08-18 ENCOUNTER — Encounter: Payer: Self-pay | Admitting: Internal Medicine

## 2010-08-18 VITALS — BP 138/53 | HR 79 | Temp 97.9°F | Ht 62.0 in | Wt 119.0 lb

## 2010-08-18 DIAGNOSIS — J449 Chronic obstructive pulmonary disease, unspecified: Secondary | ICD-10-CM

## 2010-08-18 DIAGNOSIS — Z7901 Long term (current) use of anticoagulants: Secondary | ICD-10-CM

## 2010-08-18 DIAGNOSIS — I4891 Unspecified atrial fibrillation: Secondary | ICD-10-CM

## 2010-08-18 DIAGNOSIS — R42 Dizziness and giddiness: Secondary | ICD-10-CM

## 2010-08-18 DIAGNOSIS — Z5181 Encounter for therapeutic drug level monitoring: Secondary | ICD-10-CM

## 2010-08-18 LAB — POCT INR: INR: 2

## 2010-08-18 MED ORDER — MECLIZINE HCL 25 MG PO TABS: 12.5000 mg | ORAL_TABLET | Freq: Three times a day (TID) | ORAL | Status: AC | PRN

## 2010-08-18 NOTE — Progress Notes (Signed)
Subjective:    Patient ID: [name] [name], female    DOB: [birth year], 77 y.o.   MRN: [id number]  HPI Has been dizzy Just turns her head a little and she is very "swimmy headed" Also notes spinning if she moves head in bed No nausea but stomach feels "not right" Has had this for >1 week now Has not taken any meds for this  Had slight headache one day only No diplopia or unilateral vision loss  Having increasing difficulties getting out Discussed home visits  Current Outpatient Prescriptions on File Prior to Visit  Medication Sig Dispense Refill  . albuterol-ipratropium (COMBIVENT) 18-103 MCG/ACT inhaler Inhale 2 puffs into the lungs every 6 (six) hours as needed.        . ALPRAZolam (XANAX) 0.5 MG tablet Take 0.5 mg by mouth at bedtime as needed.        . calcium-vitamin D (OSCAL) 250-125 MG-UNIT per tablet Take 1 tablet by mouth 2 (two) times daily.        . fish oil-omega-3 fatty acids 1000 MG capsule Take 2 g by mouth daily.        . furosemide (LASIX) 40 MG tablet Take 1 tablet (40 mg total) by mouth daily.  90 tablet  3  . levalbuterol (XOPENEX) 0.31 MG/3ML nebulizer solution Take 3 mLs (0.31 mg total) by nebulization every 4 (four) hours as needed for wheezing.  46 mL  3  . metoprolol succinate (TOPROL-XL) 25 MG 24 hr tablet Take 25 mg by mouth daily.        . Multiple Vitamin (MULTIVITAMIN) capsule Take 1 capsule by mouth daily.        . potassium chloride (KLOR-CON) 10 MEQ CR tablet Take 1 tablet (10 mEq total) by mouth daily.  30 tablet  11  . traZODone (DESYREL) 50 MG tablet Take 50 mg by mouth at bedtime.        . vitamin B-12 (CYANOCOBALAMIN) 1000 MCG tablet Take 1,000 mcg by mouth daily.        Marland Kitchen warfarin (COUMADIN) 5 MG tablet Take 5 mg by mouth daily.        Marland Kitchen warfarin (COUMADIN) 7.5 MG tablet Take 7.5 mg by mouth daily.          Allergies  Allergen Reactions  . Clindamycin     REACTION: rash  . Codeine Phosphate     REACTION: unspecified  . Levofloxacin    REACTION: Had change in mental status and may have caused seizure  . Prednisone     REACTION: Not clear cut--may not be allergic  . Tramadol Hcl     REACTION: visual hallucinations    Past Medical History  Diagnosis Date  . Anxiety   . Hypertension   . Osteoporosis   . COPD (chronic obstructive pulmonary disease)   . CHF (congestive heart failure)     diastolic  . Atrial fibrillation   . History of colon polyps   . Peripheral vascular disease   . Renal insufficiency   . Constipation   . Seizure     Past Surgical History  Procedure Date  . Abdominal hysterectomy   . Appendectomy   . Lumbar disc surgery     repair  . Retinal detachment surgery   . Cataract extraction     Family History  Problem Relation Age of Onset  . Cancer Mother   . Cancer Father   . Cancer Sister     breast cancer  . Diabetes Son   .  Cancer Sister     lung cancer    History   Social History  . Marital Status: Widowed    Spouse Name: N/A    Number of Children: 4  . Years of Education: N/A   Occupational History  . retired     Educational psychologist industries   Social History Main Topics  . Smoking status: Former Smoker    Types: Cigarettes  . Smokeless tobacco: Not on file  . Alcohol Use: Not on file  . Drug Use: Not on file  . Sexually Active: Not on file   Other Topics Concern  . Not on file   Social History Narrative   Hobbies: flowers, reading, genecology and sewing   Review of Systems No ringing in ears Notes popping in right ear Hearing is poor but no distinct change in last 2 weeks    Objective:   Physical Exam  Constitutional: She appears well-developed and well-nourished.  HENT:  Right Ear: External ear normal.  Left Ear: External ear normal.       TMs normal  Eyes:       Slight asymmetry in pupils Mild horizontal nystagmus with right lateral gaze          Assessment & Plan:

## 2010-08-18 NOTE — Patient Instructions (Signed)
Please cancel all appointments here Set up referral with home health for protimes and evaluation of your breathing Please take 1/2-1 of the meclizine three times daily. Once the dizziness goes away, slowly wean off this medication. Then you can just use it again if the dizziness returns

## 2010-08-18 NOTE — Patient Instructions (Signed)
Continue current dose, check in 4 weeks  Patient will no longer come here for INR's, will have home health doing them at home.

## 2010-08-18 NOTE — Assessment & Plan Note (Signed)
Clearly seems to be vestibular Will start meclizine and have her wean

## 2010-08-22 ENCOUNTER — Ambulatory Visit: Payer: Medicare Other

## 2010-08-22 ENCOUNTER — Telehealth: Payer: Self-pay | Admitting: *Deleted

## 2010-08-22 NOTE — Telephone Encounter (Signed)
The request was for [facility] That will be fine if there is some delay

## 2010-08-22 NOTE — Telephone Encounter (Signed)
Debra with Hospice called to let you know that there will be a delay in services for patient.  Nursing services will not be available until late this week or first part of next week.

## 2010-08-24 ENCOUNTER — Telehealth: Payer: Self-pay | Admitting: *Deleted

## 2010-08-24 NOTE — Telephone Encounter (Signed)
Nurse with [facility] called to let you know that per family this week is not good for them to come out to patients home so it is scheduled for Monday the 16th.

## 2010-08-24 NOTE — Telephone Encounter (Signed)
That is fine 

## 2010-09-05 ENCOUNTER — Other Ambulatory Visit: Payer: Self-pay | Admitting: *Deleted

## 2010-09-05 DIAGNOSIS — J4489 Other specified chronic obstructive pulmonary disease: Secondary | ICD-10-CM

## 2010-09-05 DIAGNOSIS — I4891 Unspecified atrial fibrillation: Secondary | ICD-10-CM

## 2010-09-05 DIAGNOSIS — R42 Dizziness and giddiness: Secondary | ICD-10-CM

## 2010-09-05 DIAGNOSIS — J449 Chronic obstructive pulmonary disease, unspecified: Secondary | ICD-10-CM

## 2010-09-05 MED ORDER — ALPRAZOLAM 0.5 MG PO TABS: 0.5000 mg | ORAL_TABLET | Freq: Every evening | ORAL | Status: DC | PRN

## 2010-09-05 MED ORDER — METOPROLOL SUCCINATE ER 25 MG PO TB24: 25.0000 mg | ORAL_TABLET | Freq: Every day | ORAL | Status: DC

## 2010-09-05 NOTE — Telephone Encounter (Signed)
Form on your desk  

## 2010-09-05 NOTE — Telephone Encounter (Signed)
Okay #[phone] x 0 

## 2010-09-05 NOTE — Telephone Encounter (Signed)
rx faxed to pharmacy manually  

## 2010-09-05 NOTE — Telephone Encounter (Signed)
rx sent to pharmacy by e-script  

## 2010-09-18 ENCOUNTER — Telehealth: Payer: Self-pay | Admitting: Family Medicine

## 2010-09-18 ENCOUNTER — Other Ambulatory Visit: Payer: Self-pay | Admitting: Internal Medicine

## 2010-09-18 LAB — PROTIME-INR

## 2010-09-18 NOTE — Telephone Encounter (Signed)
Received call from [facility] with INR today, Sunday.  INR 1.8. Reviewing chart, seems like has been stable on current dose (2.5mg  T, Th, 5mg  every other day) for last several months so just advised to continue current dose and recheck in 2 wks instead of 4 wks. Will route to PCP to be aware.

## 2010-09-19 NOTE — Telephone Encounter (Signed)
That sounds perfectly appropriate

## 2010-09-22 ENCOUNTER — Encounter: Payer: Self-pay | Admitting: Internal Medicine

## 2010-09-26 ENCOUNTER — Telehealth: Payer: Self-pay | Admitting: *Deleted

## 2010-09-26 NOTE — Telephone Encounter (Signed)
Nurse with [facility] called to verify that Dr.Letvak agrees with Dr. Timoteo Expose recommendation that pt needs PT, INR checked next week.  Per note from 8/5 advised yes.

## 2010-10-04 ENCOUNTER — Encounter: Payer: Self-pay | Admitting: Internal Medicine

## 2010-10-04 ENCOUNTER — Telehealth: Payer: Self-pay | Admitting: *Deleted

## 2010-10-04 NOTE — Telephone Encounter (Signed)
Remind patient that I will be seeing her at home visit tomorrow about 2:30PM

## 2010-10-04 NOTE — Telephone Encounter (Signed)
Spoke with [facility] nurse and advised results.

## 2010-10-04 NOTE — Telephone Encounter (Signed)
[facility] nurse called to report that pt has a cough with green sputum but her vitals are fine, sats of 93-94%, which she usually runs.  Has a few scattered wheezes but she had not had her xopenex yet.  No rales and no fever.

## 2010-10-05 ENCOUNTER — Other Ambulatory Visit: Payer: Self-pay | Admitting: *Deleted

## 2010-10-05 ENCOUNTER — Encounter: Payer: Self-pay | Admitting: Internal Medicine

## 2010-10-05 ENCOUNTER — Ambulatory Visit: Payer: Medicare Other | Admitting: Internal Medicine

## 2010-10-05 DIAGNOSIS — J449 Chronic obstructive pulmonary disease, unspecified: Secondary | ICD-10-CM

## 2010-10-05 DIAGNOSIS — F411 Generalized anxiety disorder: Secondary | ICD-10-CM

## 2010-10-05 DIAGNOSIS — I509 Heart failure, unspecified: Secondary | ICD-10-CM

## 2010-10-05 DIAGNOSIS — J209 Acute bronchitis, unspecified: Secondary | ICD-10-CM | POA: Insufficient documentation

## 2010-10-05 DIAGNOSIS — I4891 Unspecified atrial fibrillation: Secondary | ICD-10-CM

## 2010-10-05 DIAGNOSIS — I10 Essential (primary) hypertension: Secondary | ICD-10-CM

## 2010-10-05 DIAGNOSIS — I5032 Chronic diastolic (congestive) heart failure: Secondary | ICD-10-CM

## 2010-10-05 NOTE — Assessment & Plan Note (Signed)
BP Readings from Last 3 Encounters:  10/05/10 124/70  08/18/10 138/53  08/12/10 144/70   Good control No changes needed

## 2010-10-05 NOTE — Assessment & Plan Note (Signed)
May be viral Doesn't seem too sick and lungs sound clear She doesn't want an antibiotic and she may not need it Will give written rx for augmentin to use if she worsens

## 2010-10-05 NOTE — Patient Instructions (Signed)
Will plan follow up home visit in about 3 months

## 2010-10-05 NOTE — Telephone Encounter (Signed)
Okay #30 x 2 

## 2010-10-05 NOTE — Assessment & Plan Note (Signed)
Fairly well controlled Uses the xanax Trazodone helps sleep somewhat

## 2010-10-05 NOTE — Assessment & Plan Note (Signed)
Compensated now Stable fluid status She checks her weight regularly

## 2010-10-05 NOTE — Progress Notes (Signed)
Subjective:    Patient ID: [name] [name], female    DOB: 04/15/1920, 77 y.o.   MRN: [id number]  HPI Initial home visit Daughter is here  Lives in her own home Daughters check on her just about daily Family does shopping---cook for her or bring prepared foods She will occ cook something---like fried okra or corn Granddaughter cleans every other week  Walks with rolling walker or 3 wheeled rollator Remains independent with other ADLs  Has had some cough --some productive mucus Seemed to be stuck in there for a week--more up now No fever Breathing is a little worse---dyspnea just walking short distances in the house  Slight pain in epigastrium--not really in chest No particular time ---eases quickly No palpitations No edema  Current Outpatient Prescriptions on File Prior to Visit  Medication Sig Dispense Refill  . albuterol-ipratropium (COMBIVENT) 18-103 MCG/ACT inhaler Inhale 2 puffs into the lungs every 6 (six) hours as needed.        . ALPRAZolam (XANAX) 0.5 MG tablet Take 1 tablet (0.5 mg total) by mouth at bedtime as needed.  30 tablet  0  . calcium-vitamin D (OSCAL) 250-125 MG-UNIT per tablet Take 1 tablet by mouth 2 (two) times daily.        . fish oil-omega-3 fatty acids 1000 MG capsule Take 2 g by mouth daily.        . furosemide (LASIX) 40 MG tablet Take 1 tablet (40 mg total) by mouth daily.  90 tablet  3  . levalbuterol (XOPENEX) 0.31 MG/3ML nebulizer solution Take 3 mLs (0.31 mg total) by nebulization every 4 (four) hours as needed for wheezing.  46 mL  3  . metoprolol succinate (TOPROL-XL) 25 MG 24 hr tablet Take 1 tablet (25 mg total) by mouth daily.  30 tablet  11  . Multiple Vitamin (MULTIVITAMIN) capsule Take 1 capsule by mouth daily.        . potassium chloride (KLOR-CON) 10 MEQ CR tablet Take 1 tablet (10 mEq total) by mouth daily.  30 tablet  11  . traZODone (DESYREL) 50 MG tablet Take 50 mg by mouth at bedtime.        . vitamin B-12 (CYANOCOBALAMIN) 1000 MCG  tablet Take 1,000 mcg by mouth daily.        Marland Kitchen warfarin (COUMADIN) 5 MG tablet Take 5 mg by mouth daily.        Marland Kitchen warfarin (COUMADIN) 7.5 MG tablet Take 7.5 mg by mouth daily.          Allergies  Allergen Reactions  . Clindamycin     REACTION: rash  . Codeine Phosphate     REACTION: unspecified  . Levofloxacin     REACTION: Had change in mental status and may have caused seizure  . Prednisone     REACTION: Not clear cut--may not be allergic  . Tramadol Hcl     REACTION: visual hallucinations    Past Medical History  Diagnosis Date  . Anxiety   . Hypertension   . Osteoporosis   . COPD (chronic obstructive pulmonary disease)   . CHF (congestive heart failure)     diastolic  . Atrial fibrillation   . History of colon polyps   . Peripheral vascular disease   . Renal insufficiency   . Constipation   . Seizure     Past Surgical History  Procedure Date  . Abdominal hysterectomy   . Appendectomy   . Lumbar disc surgery     repair  .  Retinal detachment surgery   . Cataract extraction     Family History  Problem Relation Age of Onset  . Cancer Mother   . Cancer Father   . Cancer Sister     breast cancer  . Diabetes Son   . Cancer Sister     lung cancer    History   Social History  . Marital Status: Widowed    Spouse Name: N/A    Number of Children: 4  . Years of Education: N/A   Occupational History  . retired     Educational psychologist industries   Social History Main Topics  . Smoking status: Former Smoker    Types: Cigarettes  . Smokeless tobacco: Not on file  . Alcohol Use: Not on file  . Drug Use: Not on file  . Sexually Active: Not on file   Other Topics Concern  . Not on file   Social History Narrative   Hobbies: flowers, reading, genecology and sewing   Review of Systems Appetite is fair Weight is stable--she weighs reguarly. Stays around 124# Likes the [employer] nurse, [place]. But they are going to have to stop Sleeps variably--good nights and bad  nights. Doesn't think this is a problem. Uses the trazodone still Bowels have been fine---goes every morning Still has slight sore spot on outer right ankle--had been to wound clinic in past for this. Gets irritated on her sheets    Objective:   Physical Exam  Constitutional: She appears well-developed and well-nourished. No distress.  Neck: Normal range of motion. Neck supple. No thyromegaly present.  Cardiovascular: Normal rate and normal heart sounds.  Exam reveals no gallop.   No murmur heard.      irregular  Pulmonary/Chest: Effort normal. No respiratory distress. She has no wheezes. She has no rales.       Decreased breath sounds but clear  Abdominal: Soft. There is no tenderness.  Musculoskeletal: She exhibits no edema and no tenderness.  Lymphadenopathy:    She has no cervical adenopathy.  Psychiatric: She has a normal mood and affect. Her behavior is normal. Judgment and thought content normal.          Assessment & Plan:

## 2010-10-05 NOTE — Assessment & Plan Note (Signed)
Good rate control Still on coumadin No changes

## 2010-10-05 NOTE — Telephone Encounter (Signed)
Form on your desk Does pt get #30 or #90? Fax request stated [phone]

## 2010-10-05 NOTE — Assessment & Plan Note (Signed)
Disabling but stable Family helps her stay somewhat independent No changes now Not exacerbated with current bronchitis

## 2010-10-06 MED ORDER — ALPRAZOLAM 0.5 MG PO TABS: 0.5000 mg | ORAL_TABLET | Freq: Every evening | ORAL | Status: DC | PRN

## 2010-10-06 NOTE — Telephone Encounter (Signed)
rx called into pharmacy

## 2010-10-24 ENCOUNTER — Telehealth: Payer: Self-pay | Admitting: *Deleted

## 2010-10-24 ENCOUNTER — Inpatient Hospital Stay: Payer: Self-pay | Admitting: Internal Medicine

## 2010-10-24 NOTE — Telephone Encounter (Signed)
Spoke with daughter [name] and she's heading over to her mothers house now, because her sister that's there stated she's worse, so she's pretty sure they will take her over to the hospital. I asked her to call me back and let us know if they will take her. Also per daughter pt is allergic to prednisone. Please advise, she told me to hold off on calling anything in right now until she get to her mother's house.

## 2010-10-24 NOTE — Telephone Encounter (Signed)
Spoke with daughter and since this morning pt's breathing has gotten worse, per pt " if I could only get a good deep breath" per daughter temp is 100.4, they don't want to go back to the hospital but they will if Dr.Letvak thinks so. The family is very worried and will wait for Dr.Letvak's instructions.

## 2010-10-24 NOTE — Telephone Encounter (Signed)
Should be amoxicillin/clauvulonic acid (augmentin) Check on her in the AM I will plan to check on her tomorrow afternoon unless she is strikingly better  No prednisone due to the allergy ???decadron (will discuss tomorrow if COPD exac)

## 2010-10-24 NOTE — Telephone Encounter (Signed)
I will not have time to come out again too soon so she may need to come here If she is willing, I can go ahead with hospice referral That will allow a nurse to visit her regularly when needed I would only change the antibiotic if she isn't improving over the next couple of days

## 2010-10-24 NOTE — Telephone Encounter (Signed)
Spoke with daughter again and her mother is sleeping, so they will not take her to [facility], they will continue the abx and call us in the morning if anything changes. Is patient taking amoxicillin or augmentin? Please advise.

## 2010-10-24 NOTE — Telephone Encounter (Signed)
Call from daughter, [name], she states pt is not doing well, she's having increased SOB, they had a rx for Amoxicillin that you gave them filled yesterday, she's only had 2 doses, while she's awake she has a "nasty" cough per daughter. Patient did sleep well last night without coughing. Daughter states temp 98.4, which she states is a fever for her, I advised that was a normal temp. Daughter wanted to also know are they at the point for hospice? She's concerned that her mother will die struggling for air and they need help understanding what's going on and what will happen. Daughter wanted to know if you would come back out for a visit or if they need to bring her here? Per daughter they do not want to go back to the hospital because it gets her mom "out of wack". Please advise.

## 2010-10-24 NOTE — Telephone Encounter (Signed)
Please continue the augmentin antibiotic Use the inhaler or nebulizer Start prednisone 40mg  daily (Rx 20mg  -- 2daily, #10 x 1) i might be able to stop by tomorrow or Wednesday afternoon on my way to [facility]

## 2010-10-25 NOTE — Telephone Encounter (Signed)
Daughter calling to advise that her mom is at [facility] rm 37, with A-fib, she was also dehydrated and on the first x-ray they thought pneumonia, then this mornings x-ray they said bronchitis, also her blood pressure was very low 70/40.  Per daughter she will be there until Wednesday or Thursday, she will call and let us know.

## 2010-10-25 NOTE — Telephone Encounter (Signed)
No answer at hospital room Message left for daughter [name] on her phone

## 2010-10-27 ENCOUNTER — Telehealth: Payer: Self-pay | Admitting: *Deleted

## 2010-10-27 ENCOUNTER — Telehealth: Payer: Self-pay | Admitting: Internal Medicine

## 2010-10-27 NOTE — Telephone Encounter (Signed)
Got call she is being discharged Will plan follow up home visit next week

## 2010-10-27 NOTE — Telephone Encounter (Signed)
Daughters calling with questions about medications that pt was released from the hospital with. Pt's lasix was decreased from 40mg  to 20mg  once daily, pt now is taking Digoxin and Levofloxacin, they also are questioning when to start coumadin again? I advised that we haven't received any records from [facility]

## 2010-10-27 NOTE — Telephone Encounter (Signed)
Spoke with daughter and advised results.  

## 2010-10-27 NOTE — Telephone Encounter (Signed)
Okay on the lasix change and the digoxin I have listed levaquin as having caused a reaction and the hospital had listed penicillin allergy so stopped the augmentin I had her on We need to clarify allergies at upcoming visit If doing okay with the levaquin, okay to finish course  Start the coumadin again at half of usual dose, then resume regular dose when levaquin is done

## 2010-10-27 NOTE — Telephone Encounter (Signed)
Spoke to daughter and will plan home visit on Tuesday

## 2010-10-27 NOTE — Telephone Encounter (Signed)
[facility] called, pt is being discharged today, need Hospital Follow up in the home w/ 2 weeks. Message to Dr. Alphonsus Sias.  [facility] 10-27-2010

## 2010-11-01 ENCOUNTER — Encounter: Payer: Self-pay | Admitting: Internal Medicine

## 2010-11-01 ENCOUNTER — Ambulatory Visit: Payer: Medicare Other | Admitting: Internal Medicine

## 2010-11-01 DIAGNOSIS — I4891 Unspecified atrial fibrillation: Secondary | ICD-10-CM

## 2010-11-01 DIAGNOSIS — J449 Chronic obstructive pulmonary disease, unspecified: Secondary | ICD-10-CM

## 2010-11-01 DIAGNOSIS — I10 Essential (primary) hypertension: Secondary | ICD-10-CM

## 2010-11-01 DIAGNOSIS — F411 Generalized anxiety disorder: Secondary | ICD-10-CM

## 2010-11-01 DIAGNOSIS — I509 Heart failure, unspecified: Secondary | ICD-10-CM

## 2010-11-01 DIAGNOSIS — Z23 Encounter for immunization: Secondary | ICD-10-CM

## 2010-11-01 DIAGNOSIS — J189 Pneumonia, unspecified organism: Secondary | ICD-10-CM

## 2010-11-01 DIAGNOSIS — I5032 Chronic diastolic (congestive) heart failure: Secondary | ICD-10-CM

## 2010-11-01 NOTE — Assessment & Plan Note (Signed)
Seems to be okay

## 2010-11-01 NOTE — Assessment & Plan Note (Signed)
Overall status worse but may be temporary Needs help with ADLs due to dyspnea---could be from recuperation from the pneumonia though  FLu vaccine given today here----Left deltoid

## 2010-11-01 NOTE — Assessment & Plan Note (Signed)
Seems compensated May be related to a fib May be in sinus now---that would almost certainly benefit here

## 2010-11-01 NOTE — Assessment & Plan Note (Signed)
Sounds regular here Was started on digoxin 0.25 due to tachycardia apparently i asked her to decrease to 0.125------better dose for age and size  Due for repeat PT---was elevated at first on levaquin Will probably need early recheck (like next week)-----would check digoxin level at that time

## 2010-11-01 NOTE — Progress Notes (Signed)
Subjective:    Patient ID: [name] [name], female    DOB: 1920-09-12, 77 y.o.   MRN: [id number]  HPI Daughter [name] here  Still not feeling great Out of hospital for 5 days Has 1 more day of the levaquin Still some cough---only occ productive  Gets SOB with any activity Dyspnea with just walking short distances in house (like before getting to the bathroom)  2 daughters and granddaughter are rotating so she is not alone at all  Eating some Tries to maintain no added salt diet No nausea or vomiting Does have some abdominal aching---?related to antibiotic Has had some loose stools---down to once a day  Has had 1 visit from home health nurse Due again  Has had a couple of spells feeling her heart irreg No chest pain per se No sig edema  Current Outpatient Prescriptions on File Prior to Visit  Medication Sig Dispense Refill  . albuterol-ipratropium (COMBIVENT) 18-103 MCG/ACT inhaler Inhale 2 puffs into the lungs every 6 (six) hours as needed.        . ALPRAZolam (XANAX) 0.5 MG tablet Take 1 tablet (0.5 mg total) by mouth at bedtime as needed.  30 tablet  2  . calcium-vitamin D (OSCAL) 250-125 MG-UNIT per tablet Take 1 tablet by mouth 2 (two) times daily.        . fish oil-omega-3 fatty acids 1000 MG capsule Take 2 g by mouth daily.        . furosemide (LASIX) 40 MG tablet Take 1 tablet (40 mg total) by mouth daily.  90 tablet  3  . levalbuterol (XOPENEX) 0.31 MG/3ML nebulizer solution Take 3 mLs (0.31 mg total) by nebulization every 4 (four) hours as needed for wheezing.  46 mL  3  . metoprolol succinate (TOPROL-XL) 25 MG 24 hr tablet Take 1 tablet (25 mg total) by mouth daily.  30 tablet  11  . Multiple Vitamin (MULTIVITAMIN) capsule Take 1 capsule by mouth daily.        . potassium chloride (KLOR-CON) 10 MEQ CR tablet Take 1 tablet (10 mEq total) by mouth daily.  30 tablet  11  . traZODone (DESYREL) 50 MG tablet Take 50 mg by mouth at bedtime.        . vitamin B-12  (CYANOCOBALAMIN) 1000 MCG tablet Take 1,000 mcg by mouth daily.        Marland Kitchen warfarin (COUMADIN) 5 MG tablet Take 5 mg by mouth daily.        Marland Kitchen warfarin (COUMADIN) 7.5 MG tablet Take 7.5 mg by mouth daily.          Allergies  Allergen Reactions  . Clindamycin     REACTION: rash  . Codeine Phosphate     REACTION: unspecified  . Levofloxacin     REACTION: Had change in mental status and may have caused seizure  . Prednisone     REACTION: Not clear cut--may not be allergic  . Tramadol Hcl     REACTION: visual hallucinations    Past Medical History  Diagnosis Date  . Anxiety   . Hypertension   . Osteoporosis   . COPD (chronic obstructive pulmonary disease)   . CHF (congestive heart failure)     diastolic  . Atrial fibrillation   . History of colon polyps   . Peripheral vascular disease   . Renal insufficiency   . Constipation   . Seizure     Past Surgical History  Procedure Date  . Abdominal hysterectomy   .  Appendectomy   . Lumbar disc surgery     repair  . Retinal detachment surgery   . Cataract extraction     Family History  Problem Relation Age of Onset  . Cancer Mother   . Cancer Father   . Cancer Sister     breast cancer  . Diabetes Son   . Cancer Sister     lung cancer    History   Social History  . Marital Status: Widowed    Spouse Name: N/A    Number of Children: 4  . Years of Education: N/A   Occupational History  . retired     Educational psychologist industries   Social History Main Topics  . Smoking status: Former Smoker    Types: Cigarettes  . Smokeless tobacco: Not on file  . Alcohol Use: Not on file  . Drug Use: Not on file  . Sexually Active: Not on file   Other Topics Concern  . Not on file   Social History Narrative   Hobbies: flowers, reading, genecology and sewing   Review of Systems Has had some intermittent pain in right foot---better in past couple of days Sleeps fair with med    Objective:   Physical Exam  Constitutional: She  appears well-developed and well-nourished. No distress.       Looks weaker but no distress  Neck: Normal range of motion. No thyromegaly present.  Cardiovascular: Normal rate, regular rhythm and normal heart sounds.  Exam reveals no gallop.   No murmur heard.      Sounds regular with just occ skips  Pulmonary/Chest: Effort normal. No respiratory distress. She has no wheezes. She has no rales.       Decreased breath sounds but sounds clear  Abdominal: Soft. There is no tenderness.  Musculoskeletal:       Slight right foot puffiness Faint pulses  Lymphadenopathy:    She has no cervical adenopathy.  Skin:       No ulcers  Psychiatric: She has a normal mood and affect. Her behavior is normal.          Assessment & Plan:

## 2010-11-01 NOTE — Assessment & Plan Note (Signed)
Clinically better but still very fatigued Clear drop in functional level from last visit---hopefully should improve ??PT coming from hospital stay

## 2010-11-01 NOTE — Assessment & Plan Note (Signed)
BP Readings from Last 3 Encounters:  11/01/10 112/48  10/05/10 124/70  08/18/10 138/53   Briefly hypotensive in hospital Lasix decreased Continue the metoprolol

## 2010-11-02 NOTE — Progress Notes (Signed)
Addended by: Sueanne Margarita on: 11/02/2010 08:06 AM   Modules accepted: Orders

## 2010-11-03 ENCOUNTER — Telehealth: Payer: Self-pay | Admitting: *Deleted

## 2010-11-03 ENCOUNTER — Ambulatory Visit: Payer: Medicare Other

## 2010-11-03 NOTE — Telephone Encounter (Signed)
Home health nurse called to report pt's INR of 1.1 today.  She takes 5 mg's of coumadin every day except tuesdays and thursdays, then she takes one half tablet- 2.5 mg's.  Please call with instructions.

## 2010-11-03 NOTE — Telephone Encounter (Signed)
Had been on hold in hospital for a while due to the levaquin raising it Please have her continue the same dose and recheck in 1 week

## 2010-11-03 NOTE — Telephone Encounter (Signed)
Spoke with home health nurse and advised results

## 2010-11-09 DIAGNOSIS — J189 Pneumonia, unspecified organism: Secondary | ICD-10-CM

## 2010-11-09 DIAGNOSIS — I509 Heart failure, unspecified: Secondary | ICD-10-CM

## 2010-11-09 DIAGNOSIS — I4891 Unspecified atrial fibrillation: Secondary | ICD-10-CM

## 2010-11-09 DIAGNOSIS — R269 Unspecified abnormalities of gait and mobility: Secondary | ICD-10-CM

## 2010-11-10 ENCOUNTER — Telehealth: Payer: Self-pay | Admitting: *Deleted

## 2010-11-10 NOTE — Telephone Encounter (Signed)
Home health nurse called to report pts PT is 17.4 today, INR is 1.4.  She drew a digoxin level, sent that to the lab.  Please advise pt regarding coumadin dose.

## 2010-11-10 NOTE — Telephone Encounter (Signed)
Please have her increase the coumadin to 5mg  daily Recheck protime again in 2 weeks Please take 7.5mg  dose off med list

## 2010-11-11 ENCOUNTER — Encounter: Payer: Self-pay | Admitting: Internal Medicine

## 2010-11-11 NOTE — Telephone Encounter (Signed)
Left message on hospice nurse voicemail, advised to call if any questions

## 2010-11-15 ENCOUNTER — Telehealth: Payer: Self-pay | Admitting: *Deleted

## 2010-11-15 NOTE — Telephone Encounter (Signed)
Home health nurse pt is done with home nursing services- she is doing well, all goals have been met, but she will continue with physical therapy for a few more visits.  She will be having her INR checked here next time.

## 2010-11-16 NOTE — Telephone Encounter (Signed)
okay

## 2010-11-24 ENCOUNTER — Ambulatory Visit (INDEPENDENT_AMBULATORY_CARE_PROVIDER_SITE_OTHER): Payer: Medicare Other | Admitting: Internal Medicine

## 2010-11-24 DIAGNOSIS — Z7901 Long term (current) use of anticoagulants: Secondary | ICD-10-CM

## 2010-11-24 DIAGNOSIS — Z5181 Encounter for therapeutic drug level monitoring: Secondary | ICD-10-CM

## 2010-11-24 DIAGNOSIS — I4891 Unspecified atrial fibrillation: Secondary | ICD-10-CM

## 2010-11-24 NOTE — Patient Instructions (Signed)
Continue current dose, check in 4 weeks  

## 2010-12-06 ENCOUNTER — Other Ambulatory Visit: Payer: Self-pay | Admitting: *Deleted

## 2010-12-06 MED ORDER — WARFARIN SODIUM 5 MG PO TABS: 5.0000 mg | ORAL_TABLET | Freq: Every day | ORAL | Status: DC

## 2010-12-06 NOTE — Telephone Encounter (Signed)
Received faxed refill request from pharmacy Refill sent electronically to pharmacy. 

## 2010-12-08 ENCOUNTER — Ambulatory Visit: Payer: Medicare Other | Admitting: Internal Medicine

## 2010-12-21 ENCOUNTER — Other Ambulatory Visit: Payer: Self-pay | Admitting: *Deleted

## 2010-12-21 NOTE — Telephone Encounter (Signed)
Received fax asking for refill of nitrostat, last refilled 04/20/09, please advise

## 2010-12-22 ENCOUNTER — Ambulatory Visit (INDEPENDENT_AMBULATORY_CARE_PROVIDER_SITE_OTHER): Payer: Medicare Other | Admitting: Internal Medicine

## 2010-12-22 DIAGNOSIS — Z5181 Encounter for therapeutic drug level monitoring: Secondary | ICD-10-CM

## 2010-12-22 DIAGNOSIS — Z7901 Long term (current) use of anticoagulants: Secondary | ICD-10-CM

## 2010-12-22 DIAGNOSIS — I4891 Unspecified atrial fibrillation: Secondary | ICD-10-CM

## 2010-12-22 LAB — POCT INR: INR: 2.8

## 2010-12-22 MED ORDER — NITROGLYCERIN 0.4 MG SL SUBL: 0.4000 mg | SUBLINGUAL_TABLET | SUBLINGUAL | Status: AC | PRN

## 2010-12-22 NOTE — Telephone Encounter (Signed)
Okay to refill #25 x 2

## 2010-12-22 NOTE — Telephone Encounter (Signed)
rx sent to pharmacy by e-script  

## 2010-12-22 NOTE — Patient Instructions (Signed)
Continue current dose, check in 4 weeks  

## 2011-01-16 ENCOUNTER — Other Ambulatory Visit: Payer: Self-pay | Admitting: *Deleted

## 2011-01-16 MED ORDER — IPRATROPIUM-ALBUTEROL 18-103 MCG/ACT IN AERO: 2.0000 | INHALATION_SPRAY | Freq: Four times a day (QID) | RESPIRATORY_TRACT | Status: DC

## 2011-01-17 ENCOUNTER — Other Ambulatory Visit: Payer: Self-pay | Admitting: Internal Medicine

## 2011-01-17 MED ORDER — DIGOXIN 125 MCG PO TABS: 125.0000 ug | ORAL_TABLET | Freq: Every day | ORAL | Status: DC

## 2011-01-17 NOTE — Telephone Encounter (Signed)
No, I would like her to continue the 125 mcg dose as before Can refill for a year if needed

## 2011-01-17 NOTE — Telephone Encounter (Signed)
[name] [name] from [facility] pharmacy called and stated that patient was discharged with the medication Digoxin .25 take 1 tablet daily #30 by Dr. Dava Najjar and they would like to know if you would like to continue this medication.

## 2011-01-17 NOTE — Telephone Encounter (Signed)
Spoke with Amy at [facility] and sent rx over for 1 year

## 2011-01-19 ENCOUNTER — Ambulatory Visit (INDEPENDENT_AMBULATORY_CARE_PROVIDER_SITE_OTHER): Payer: Medicare Other | Admitting: Internal Medicine

## 2011-01-19 DIAGNOSIS — Z5181 Encounter for therapeutic drug level monitoring: Secondary | ICD-10-CM

## 2011-01-19 DIAGNOSIS — Z7901 Long term (current) use of anticoagulants: Secondary | ICD-10-CM

## 2011-01-19 DIAGNOSIS — I4891 Unspecified atrial fibrillation: Secondary | ICD-10-CM

## 2011-01-19 LAB — POCT INR: INR: 2.3

## 2011-01-19 NOTE — Patient Instructions (Signed)
Continue current dose, check in 4 weeks  

## 2011-02-01 ENCOUNTER — Other Ambulatory Visit: Payer: Self-pay | Admitting: Internal Medicine

## 2011-02-01 ENCOUNTER — Ambulatory Visit: Payer: Medicare Other | Admitting: Internal Medicine

## 2011-02-01 ENCOUNTER — Encounter: Payer: Self-pay | Admitting: Internal Medicine

## 2011-02-01 VITALS — BP 152/68 | HR 66 | Resp 18 | Wt 120.2 lb

## 2011-02-01 DIAGNOSIS — I5032 Chronic diastolic (congestive) heart failure: Secondary | ICD-10-CM

## 2011-02-01 DIAGNOSIS — F411 Generalized anxiety disorder: Secondary | ICD-10-CM

## 2011-02-01 DIAGNOSIS — I4891 Unspecified atrial fibrillation: Secondary | ICD-10-CM

## 2011-02-01 DIAGNOSIS — J449 Chronic obstructive pulmonary disease, unspecified: Secondary | ICD-10-CM

## 2011-02-01 DIAGNOSIS — I10 Essential (primary) hypertension: Secondary | ICD-10-CM

## 2011-02-01 DIAGNOSIS — I509 Heart failure, unspecified: Secondary | ICD-10-CM

## 2011-02-01 NOTE — Assessment & Plan Note (Signed)
Stable status No recent infections Moderate activity restrictions but no sig changes Still lives alone with help from daughters

## 2011-02-01 NOTE — Progress Notes (Signed)
Subjective:    Patient ID: [name] [name], female    DOB: 10/21/1920, 77 y.o.   MRN: [id number]  HPI Daughter [name] is here  Doing only "fair" Frustrated by not being able to get out  Still having upper left shoulder pain Now more medial than in the past Hard to use arm above her head or behind her No recent fall or injury Uses walker most of the time (cane sometimes) Doesn't use meds  Has occ trouble with swallowing Drinking or with foot--but mostly food---really meat Takes a while to go down Chews it well No heartburn though Discomfort will spread out from chest to shoulders  Breathing has been okay Occ cough but no fever Continues on oxygen all the time  No different chest pain (other than with swallowing) Occ palpitations--mostly just sitting. Not often Has nitro pills to take (1/2) if it gets bad  Nerves act up at times---mostly at night Uses the xanax to calm down about 8:30 at night  Current Outpatient Prescriptions on File Prior to Visit  Medication Sig Dispense Refill  . albuterol-ipratropium (COMBIVENT) 18-103 MCG/ACT inhaler Inhale 2 puffs into the lungs 4 (four) times daily.  14.7 g  11  . ALPRAZolam (XANAX) 0.5 MG tablet Take 1 tablet (0.5 mg total) by mouth at bedtime as needed.  30 tablet  2  . calcium-vitamin D (OSCAL) 250-125 MG-UNIT per tablet Take 1 tablet by mouth 2 (two) times daily.        . digoxin (LANOXIN) 0.125 MG tablet Take 1 tablet (125 mcg total) by mouth daily.  30 tablet  11  . fish oil-omega-3 fatty acids 1000 MG capsule Take 2 g by mouth daily.        . furosemide (LASIX) 40 MG tablet Take 20 mg by mouth daily.        Marland Kitchen levalbuterol (XOPENEX) 0.31 MG/3ML nebulizer solution Take 3 mLs (0.31 mg total) by nebulization every 4 (four) hours as needed for wheezing.  46 mL  3  . metoprolol succinate (TOPROL-XL) 25 MG 24 hr tablet Take 1 tablet (25 mg total) by mouth daily.  30 tablet  11  . Multiple Vitamin (MULTIVITAMIN) capsule Take 1 capsule  by mouth daily.        . nitroGLYCERIN (NITROSTAT) 0.4 MG SL tablet Place 1 tablet (0.4 mg total) under the tongue every 5 (five) minutes as needed.  25 tablet  2  . potassium chloride (KLOR-CON) 10 MEQ CR tablet Take 1 tablet (10 mEq total) by mouth daily.  30 tablet  11  . traZODone (DESYREL) 50 MG tablet Take 50 mg by mouth at bedtime.        . vitamin B-12 (CYANOCOBALAMIN) 1000 MCG tablet Take 1,000 mcg by mouth daily.        Marland Kitchen warfarin (COUMADIN) 5 MG tablet Take 1 tablet (5 mg total) by mouth daily.  30 tablet  5    Allergies  Allergen Reactions  . Clindamycin     REACTION: rash  . Codeine Phosphate     REACTION: unspecified  . Prednisone     REACTION: Not clear cut--may not be allergic  . Tramadol Hcl     REACTION: visual hallucinations    Past Medical History  Diagnosis Date  . Anxiety   . Hypertension   . Osteoporosis   . COPD (chronic obstructive pulmonary disease)   . CHF (congestive heart failure)     diastolic  . Atrial fibrillation   . History of colon  polyps   . Peripheral vascular disease   . Renal insufficiency   . Constipation   . Seizure   . PERIPHERAL VASCULAR DISEASE 09/26/2006    Past Surgical History  Procedure Date  . Abdominal hysterectomy   . Appendectomy   . Lumbar disc surgery     repair  . Retinal detachment surgery   . Cataract extraction     Family History  Problem Relation Age of Onset  . Cancer Mother   . Cancer Father   . Cancer Sister     breast cancer  . Diabetes Son   . Cancer Sister     lung cancer    History   Social History  . Marital Status: Widowed    Spouse Name: N/A    Number of Children: 4  . Years of Education: N/A   Occupational History  . retired     Educational psychologist industries   Social History Main Topics  . Smoking status: Former Smoker    Types: Cigarettes  . Smokeless tobacco: Not on file  . Alcohol Use: Not on file  . Drug Use: Not on file  . Sexually Active: Not on file   Other Topics Concern    . Not on file   Social History Narrative   Hobbies: flowers, reading, genecology and sewing   Review of Systems Appetite still not great Weight is fairly stable Usually sleeps okay Just had nails trimmed and feet checked by Dr Orland Jarred     Objective:   Physical Exam  Constitutional: She appears well-developed. No distress.  Neck: Normal range of motion.  Cardiovascular: Normal rate and normal heart sounds.  Exam reveals no gallop.   No murmur heard.      Slightly irregular Very faint pedal pulses  Pulmonary/Chest: Effort normal. No respiratory distress. She has no wheezes. She has no rales.       Decreased breath sounds but clear  Abdominal: Soft. There is no tenderness.  Musculoskeletal: She exhibits no edema and no tenderness.  Lymphadenopathy:    She has no cervical adenopathy.  Skin:       No foot ulcers  Psychiatric: She has a normal mood and affect. Her behavior is normal. Judgment and thought content normal.          Assessment & Plan:

## 2011-02-01 NOTE — Assessment & Plan Note (Signed)
Uses xanax mostly in evening

## 2011-02-01 NOTE — Assessment & Plan Note (Signed)
Stable fluid status Likely very rate dependent and HR is good now Takes 1/2 furosemide daily

## 2011-02-01 NOTE — Assessment & Plan Note (Signed)
Sounds irregular but good rate Will check dig level with next protime

## 2011-02-01 NOTE — Assessment & Plan Note (Signed)
BP Readings from Last 3 Encounters:  02/01/11 152/68  11/01/10 112/48  10/05/10 124/70   Generally well controlled so no change today

## 2011-02-13 ENCOUNTER — Other Ambulatory Visit: Payer: Self-pay | Admitting: *Deleted

## 2011-02-13 MED ORDER — ALPRAZOLAM 0.5 MG PO TABS: 0.5000 mg | ORAL_TABLET | Freq: Every evening | ORAL | Status: DC | PRN

## 2011-02-13 NOTE — Telephone Encounter (Signed)
Okay #30 x 2

## 2011-02-13 NOTE — Telephone Encounter (Signed)
rx called into pharmacy

## 2011-02-14 ENCOUNTER — Emergency Department: Payer: Self-pay | Admitting: Internal Medicine

## 2011-02-14 LAB — COMPREHENSIVE METABOLIC PANEL
Alkaline Phosphatase: 67 U/L (ref 50–136)
BUN: 27 mg/dL — ABNORMAL HIGH (ref 7–18)
Bilirubin,Total: 0.9 mg/dL (ref 0.2–1.0)
Calcium, Total: 8.8 mg/dL (ref 8.5–10.1)
Chloride: 98 mmol/L (ref 98–107)
Co2: 30 mmol/L (ref 21–32)
Creatinine: 1.46 mg/dL — ABNORMAL HIGH (ref 0.60–1.30)
EGFR (Non-African Amer.): 36 — ABNORMAL LOW
Glucose: 163 mg/dL — ABNORMAL HIGH (ref 65–99)
Osmolality: 283 (ref 275–301)
Sodium: 137 mmol/L (ref 136–145)
Total Protein: 7.2 g/dL (ref 6.4–8.2)

## 2011-02-14 LAB — CBC
HCT: 34.8 % — ABNORMAL LOW (ref 35.0–47.0)
HGB: 11.3 g/dL — ABNORMAL LOW (ref 12.0–16.0)
MCH: 25.9 pg — ABNORMAL LOW (ref 26.0–34.0)
MCHC: 32.4 g/dL (ref 32.0–36.0)
MCV: 80 fL (ref 80–100)
RBC: 4.35 10*6/uL (ref 3.80–5.20)

## 2011-02-14 LAB — TROPONIN I: Troponin-I: <0.02 ng/mL

## 2011-02-14 LAB — CK TOTAL AND CKMB (NOT AT ARMC): CK-MB: <0.5 ng/mL — ABNORMAL LOW (ref 0.5–3.6)

## 2011-02-15 ENCOUNTER — Telehealth: Payer: Self-pay | Admitting: *Deleted

## 2011-02-15 NOTE — Telephone Encounter (Signed)
Daughter calling stating that she took patient to [facility] ER and was diagnosed with bronchitis , pt was having pain on her left side and fluid on her lower left side, per daughter pt had fever of 101.4 last night, pt was given z-pak and prednisone 500mg  for 3 days, per daughter pt feels better today but is still weak, no fever. Daughter asking if Dr.Letvak need to come see the pt?( he does home visits on her)  I advised that Dr.Letvak is not in and that I would ask another physician for advice. I did state that maybe she should give the medication time to work, pt just got home this morning at 6am. I also advised I would call for the ER report ( it's not ready, it's too soon) I also stated that if she needed to be seen then she may have to come her to the office that other physicians don't do home visits.

## 2011-02-15 NOTE — Telephone Encounter (Signed)
Agreed with above.

## 2011-02-16 ENCOUNTER — Ambulatory Visit: Payer: Medicare Other

## 2011-02-17 ENCOUNTER — Ambulatory Visit (INDEPENDENT_AMBULATORY_CARE_PROVIDER_SITE_OTHER): Payer: Medicare Other | Admitting: Family Medicine

## 2011-02-17 DIAGNOSIS — Z5181 Encounter for therapeutic drug level monitoring: Secondary | ICD-10-CM

## 2011-02-17 DIAGNOSIS — I4891 Unspecified atrial fibrillation: Secondary | ICD-10-CM

## 2011-02-17 DIAGNOSIS — Z7901 Long term (current) use of anticoagulants: Secondary | ICD-10-CM

## 2011-02-17 LAB — POCT INR: INR: 5.3

## 2011-02-17 LAB — BASIC METABOLIC PANEL
Chloride: 98 mEq/L (ref 96–112)
GFR: 44.19 mL/min — ABNORMAL LOW (ref >60.00)
Potassium: 3.8 mEq/L (ref 3.5–5.1)
Sodium: 142 mEq/L (ref 135–145)

## 2011-02-17 NOTE — Patient Instructions (Signed)
Hold x 2 days then2.5 mg daily, taking a 5 day z pak check in 1 week

## 2011-02-18 LAB — DIGOXIN LEVEL: Digoxin Level: 0.8 ng/mL (ref 0.8–2.0)

## 2011-02-24 ENCOUNTER — Ambulatory Visit (INDEPENDENT_AMBULATORY_CARE_PROVIDER_SITE_OTHER): Payer: Medicare Other | Admitting: Internal Medicine

## 2011-02-24 DIAGNOSIS — Z5181 Encounter for therapeutic drug level monitoring: Secondary | ICD-10-CM

## 2011-02-24 DIAGNOSIS — Z7901 Long term (current) use of anticoagulants: Secondary | ICD-10-CM

## 2011-02-24 DIAGNOSIS — I4891 Unspecified atrial fibrillation: Secondary | ICD-10-CM

## 2011-02-24 LAB — POCT INR: INR: 1.5

## 2011-02-24 NOTE — Patient Instructions (Signed)
Back on regular dose of 5 mg daily except 2.5 mg T/ Th, check in 1 week

## 2011-02-27 ENCOUNTER — Encounter: Payer: Self-pay | Admitting: *Deleted

## 2011-03-03 ENCOUNTER — Ambulatory Visit (INDEPENDENT_AMBULATORY_CARE_PROVIDER_SITE_OTHER): Payer: Medicare Other | Admitting: Internal Medicine

## 2011-03-03 DIAGNOSIS — I4891 Unspecified atrial fibrillation: Secondary | ICD-10-CM

## 2011-03-03 DIAGNOSIS — Z7901 Long term (current) use of anticoagulants: Secondary | ICD-10-CM

## 2011-03-03 DIAGNOSIS — Z5181 Encounter for therapeutic drug level monitoring: Secondary | ICD-10-CM

## 2011-03-03 LAB — POCT INR: INR: 2.5

## 2011-03-03 NOTE — Patient Instructions (Signed)
Continue current dose, check in 4 weeks  

## 2011-03-12 IMAGING — CR DG CHEST 2V
1 series · 2 of 2 positions shown · non-contrast
Comparison: none

REASON FOR EXAM: Shortness of Breath
COMMENTS:   May transport without cardiac monitor

PROCEDURE:     DXR - DXR CHEST PA (OR AP) AND LATERAL  - February 01, 2010  [DATE]
RESULT:     Comparison: None

[Series 1: view not recorded · 0.17mm/px · 2 of 2 slices shown]
[im 1/2]
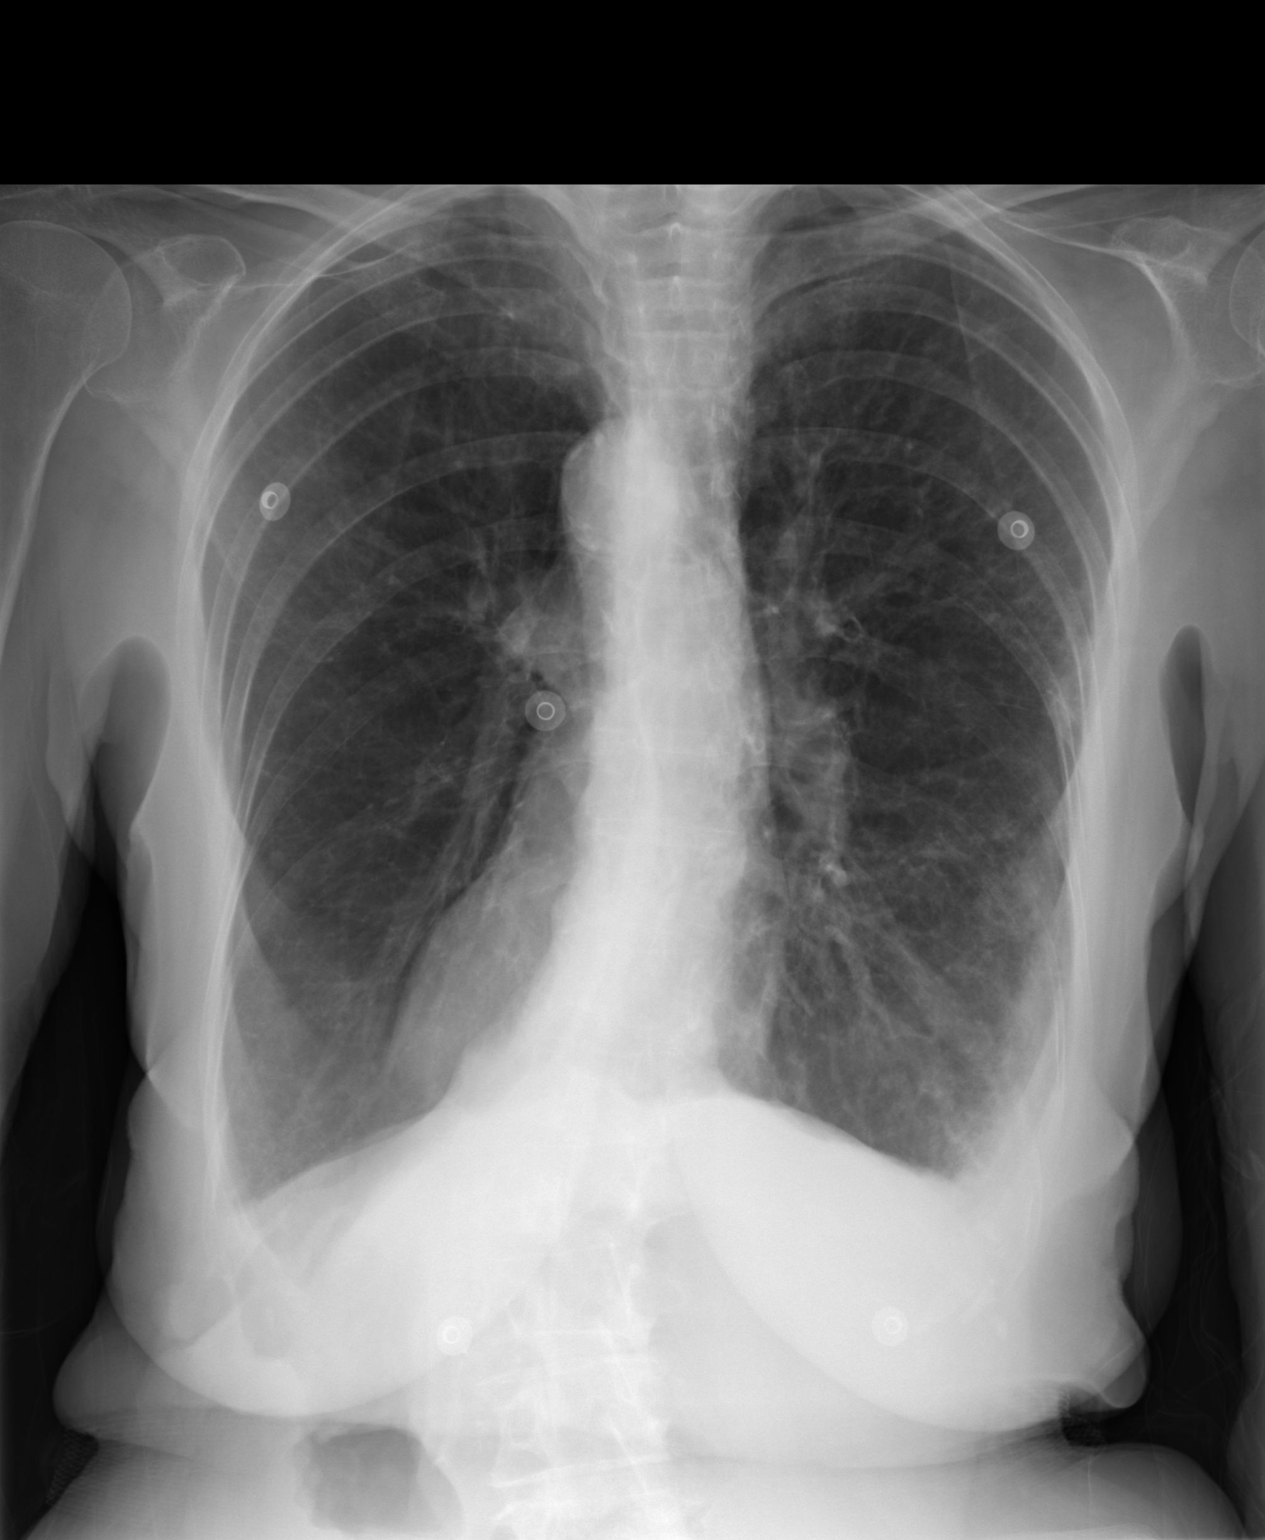
[im 2/2]
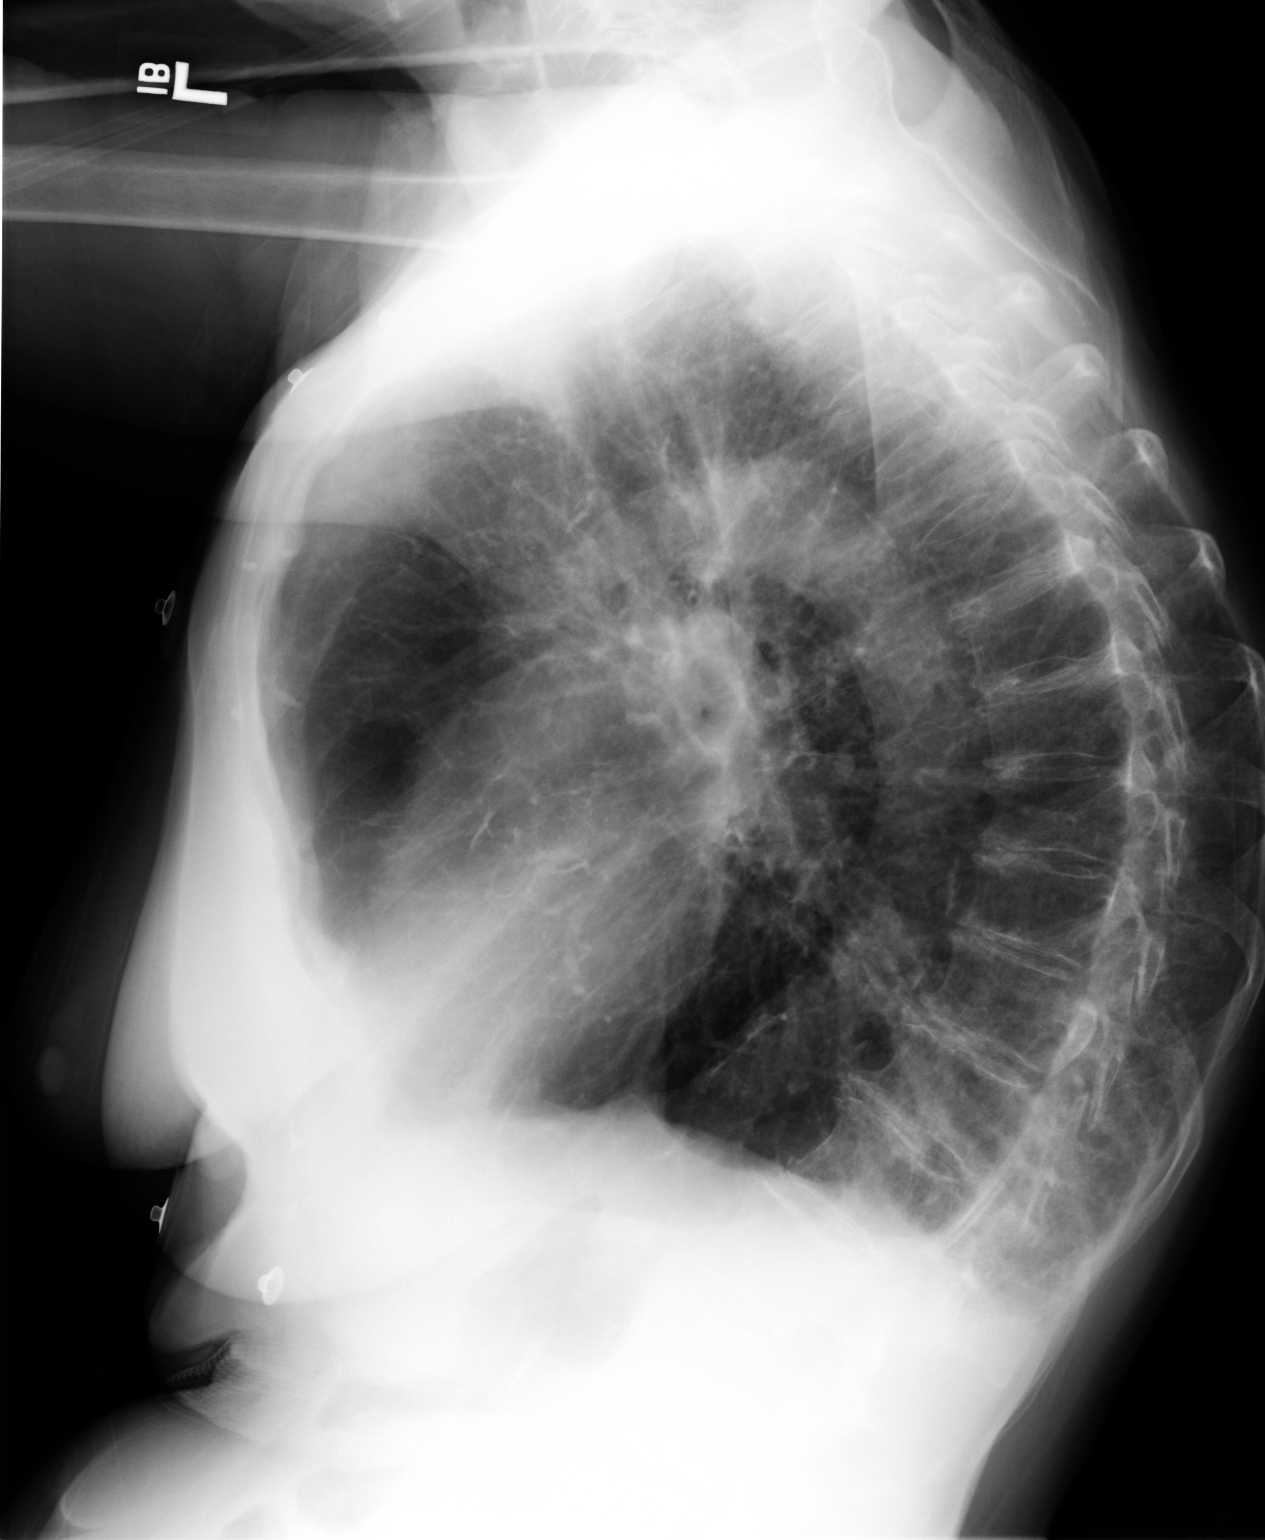

[2 of 2 positions shown; findings below may reference images not displayed]

FINDINGS: PA and lateral chest radiographs are provided. There is right basilar
pleural thickening. There is no left pleural effusion. There is no
pneumothorax. There is no focal consolidation. There bilateral emphysematous
changes. The heart and mediastinum are unremarkable. The osseous structures
are unremarkable.
IMPRESSION: No acute disease of the chest.

## 2011-03-31 ENCOUNTER — Encounter: Payer: Self-pay | Admitting: Family Medicine

## 2011-03-31 ENCOUNTER — Ambulatory Visit (INDEPENDENT_AMBULATORY_CARE_PROVIDER_SITE_OTHER): Payer: Medicare Other | Admitting: Family Medicine

## 2011-03-31 ENCOUNTER — Telehealth: Payer: Self-pay | Admitting: Internal Medicine

## 2011-03-31 ENCOUNTER — Ambulatory Visit: Payer: Medicare Other

## 2011-03-31 DIAGNOSIS — I4891 Unspecified atrial fibrillation: Secondary | ICD-10-CM

## 2011-03-31 DIAGNOSIS — J449 Chronic obstructive pulmonary disease, unspecified: Secondary | ICD-10-CM

## 2011-03-31 DIAGNOSIS — Z7901 Long term (current) use of anticoagulants: Secondary | ICD-10-CM

## 2011-03-31 DIAGNOSIS — Z5181 Encounter for therapeutic drug level monitoring: Secondary | ICD-10-CM

## 2011-03-31 DIAGNOSIS — R7301 Impaired fasting glucose: Secondary | ICD-10-CM

## 2011-03-31 NOTE — Patient Instructions (Signed)
Continue current dose, check in 4 weeks  

## 2011-03-31 NOTE — Patient Instructions (Signed)
Use the xopenex neb 3 times a day and I'll send a note to Dr. Alphonsus Sias.   Take care.

## 2011-03-31 NOTE — Progress Notes (Signed)
COPD and CHF on oxygen.  She is slightly more dyspneic recently.  SOB with exertion.  Rarely has had chest pain, episodic, can happen at rest, not more likely to happen with exertion.  Very brief, a few seconds or less. Going on episodically over the last year.    She uses combivent 4 times a day with relief. Uses xopenex neb about 2x per day.    She has been wheezing more per her report.  There is a question of her tolerance to steroids.  No fevers, minimal sputum.   Meds, vitals, and allergies reviewed.   ROS: See HPI.  Otherwise, noncontributory.  Nad, on O2 via n/c Chronically ill appearing in w/c but nontoxic.  IRR but not tachy Dec in BS globally but now focal dec, no inc in wob No cyanosis No edema Speaking easily in full sentences

## 2011-03-31 NOTE — Telephone Encounter (Signed)
Phone note 

## 2011-03-31 NOTE — Assessment & Plan Note (Addendum)
We talked about options.  Nontoxic, will notify PCP upon return to clinic.  Will inc xopenex in meantime to tid, up from bid.  She agrees.  Can f/u with PMD prn, I will defer the decision on inhaled steroids to PMD.  Okay for outpatient f/u.  She doesn't appear to be in failure, appears euvolemic.  I am not concerned for acute pulm process.

## 2011-03-31 NOTE — Telephone Encounter (Signed)
They are wanting to know if the exam today would count for her qualifying for oxygen with Medicare/Medicaid.  I informed her that it would not.  That appt needs to be made through her PCP.  Patient agrees.

## 2011-04-03 ENCOUNTER — Telehealth: Payer: Self-pay | Admitting: *Deleted

## 2011-04-03 NOTE — Telephone Encounter (Signed)
Good to hear No changes unless she doesn't continue to improve and gain strength

## 2011-04-03 NOTE — Telephone Encounter (Signed)
Called to check on pt she was seen by Dr.Duncan on Friday. Spoke with patient and she's using the medication more and feeling a little better, just weak. Per patient breathing is better.   I would hold off on other Rx unless she worsens  Dee, Please call to check on her today  ----- Message ----- From: Joaquim Nam, MD Sent: 04/03/2011 To: Varney Baas, MD

## 2011-04-04 ENCOUNTER — Other Ambulatory Visit: Payer: Self-pay | Admitting: *Deleted

## 2011-04-04 MED ORDER — ALPRAZOLAM 0.5 MG PO TABS: ORAL_TABLET | ORAL | Status: DC

## 2011-04-04 NOTE — Telephone Encounter (Signed)
rx called into pharmacy Med list updated 

## 2011-04-04 NOTE — Telephone Encounter (Signed)
Received a fax from [facility] that says patient in now taken her alprazolam 0.5 at 1 1/2 tablets at bedtime not just 1 tablet patient would like a refill with the directions and quantity changed.

## 2011-04-04 NOTE — Telephone Encounter (Signed)
Okay to change in chart and send #45 x 0

## 2011-04-27 ENCOUNTER — Ambulatory Visit: Payer: Medicare Other

## 2011-04-28 ENCOUNTER — Ambulatory Visit: Payer: Medicare Other

## 2011-05-02 ENCOUNTER — Encounter: Payer: Self-pay | Admitting: Internal Medicine

## 2011-05-02 ENCOUNTER — Telehealth: Payer: Self-pay | Admitting: *Deleted

## 2011-05-02 ENCOUNTER — Ambulatory Visit (INDEPENDENT_AMBULATORY_CARE_PROVIDER_SITE_OTHER): Payer: Medicare Other | Admitting: Internal Medicine

## 2011-05-02 VITALS — BP 138/60 | HR 69 | Temp 98.2°F | Ht 62.0 in | Wt 120.0 lb

## 2011-05-02 DIAGNOSIS — I4891 Unspecified atrial fibrillation: Secondary | ICD-10-CM

## 2011-05-02 DIAGNOSIS — Z5181 Encounter for therapeutic drug level monitoring: Secondary | ICD-10-CM

## 2011-05-02 DIAGNOSIS — I5032 Chronic diastolic (congestive) heart failure: Secondary | ICD-10-CM

## 2011-05-02 DIAGNOSIS — K219 Gastro-esophageal reflux disease without esophagitis: Secondary | ICD-10-CM | POA: Insufficient documentation

## 2011-05-02 DIAGNOSIS — F411 Generalized anxiety disorder: Secondary | ICD-10-CM

## 2011-05-02 DIAGNOSIS — J449 Chronic obstructive pulmonary disease, unspecified: Secondary | ICD-10-CM

## 2011-05-02 DIAGNOSIS — I509 Heart failure, unspecified: Secondary | ICD-10-CM

## 2011-05-02 DIAGNOSIS — Z7901 Long term (current) use of anticoagulants: Secondary | ICD-10-CM

## 2011-05-02 MED ORDER — TRAZODONE HCL 50 MG PO TABS: 50.0000 mg | ORAL_TABLET | Freq: Every day | ORAL | Status: DC

## 2011-05-02 NOTE — Telephone Encounter (Signed)
Opened in error

## 2011-05-02 NOTE — Assessment & Plan Note (Signed)
Does okay with the xanax prn Will have her restart trazodone for sleep

## 2011-05-02 NOTE — Assessment & Plan Note (Signed)
Stable fluid status Mild increased DOE which is probably pulmonary

## 2011-05-02 NOTE — Patient Instructions (Signed)
I will call when your next home visit it due---probably in June

## 2011-05-02 NOTE — Assessment & Plan Note (Signed)
Good rate control Remains on coumadin 

## 2011-05-02 NOTE — Progress Notes (Signed)
Subjective:    Patient ID: [name] [name], female    DOB: 04/13/1920, 78 y.o.   MRN: [id number]  HPI Came in to office for recertifying oxygen Here with daughter [name] Uses oxygen 2-3l/min continuous Sat on RA here 88% at rest  Feels cooped up in house Has had some throat trouble Using delsym Some chronic cough but doesn't seem to be sick Has had some dyspnea spells---tried part of xanax under her tongue. This did help  Walks with walker Dresses on her own. Needs help with shower (or takes sponge bath) Wears out easier---trouble even walking from chair to kitchen Eats prepared foods brought by family Family clean house and dishes---someone is there every day (at least some of the time) Granddaughter no longer staying the night with her Another granddaughter comes in weekly to clean thoroughly Considering hiring to watch her when daughters can't be there  No chest pain but did have some left neck pain in bed the other night Occ gets palpitation No edema  Current Outpatient Prescriptions on File Prior to Visit  Medication Sig Dispense Refill  . albuterol-ipratropium (COMBIVENT) 18-103 MCG/ACT inhaler Inhale 2 puffs into the lungs 4 (four) times daily.  14.7 g  11  . ALPRAZolam (XANAX) 0.5 MG tablet Take 1 1/2 tablet by mouth at bedtime as needed  45 tablet  0  . calcium-vitamin D (OSCAL) 250-125 MG-UNIT per tablet Take 1 tablet by mouth 2 (two) times daily.        . digoxin (LANOXIN) 0.125 MG tablet Take 1 tablet (125 mcg total) by mouth daily.  30 tablet  11  . fish oil-omega-3 fatty acids 1000 MG capsule Take 2 g by mouth daily.        . furosemide (LASIX) 40 MG tablet Take 20 mg by mouth daily.        Marland Kitchen levalbuterol (XOPENEX) 0.31 MG/3ML nebulizer solution Take 3 mLs (0.31 mg total) by nebulization every 4 (four) hours as needed for wheezing.  46 mL  3  . metoprolol succinate (TOPROL-XL) 25 MG 24 hr tablet Take 1 tablet (25 mg total) by mouth daily.  30 tablet  11  . Multiple  Vitamin (MULTIVITAMIN) capsule Take 1 capsule by mouth daily.        . nitroGLYCERIN (NITROSTAT) 0.4 MG SL tablet Place 1 tablet (0.4 mg total) under the tongue every 5 (five) minutes as needed.  25 tablet  2  . potassium chloride (KLOR-CON) 10 MEQ CR tablet Take 1 tablet (10 mEq total) by mouth daily.  30 tablet  11  . traZODone (DESYREL) 50 MG tablet Take 50 mg by mouth at bedtime.        . vitamin B-12 (CYANOCOBALAMIN) 1000 MCG tablet Take 1,000 mcg by mouth daily.        Marland Kitchen warfarin (COUMADIN) 5 MG tablet Take 1 tablet (5 mg total) by mouth daily.  30 tablet  5    Allergies  Allergen Reactions  . Clindamycin     REACTION: rash  . Codeine Phosphate     REACTION: unspecified  . Prednisone     REACTION: Not clear cut--may not be allergic  . Tramadol Hcl     REACTION: visual hallucinations    Past Medical History  Diagnosis Date  . Anxiety   . Hypertension   . Osteoporosis   . COPD (chronic obstructive pulmonary disease)   . CHF (congestive heart failure)     diastolic  . Atrial fibrillation   . History of  colon polyps   . Peripheral vascular disease   . Renal insufficiency   . Constipation   . Seizure   . PERIPHERAL VASCULAR DISEASE 09/26/2006    Past Surgical History  Procedure Date  . Abdominal hysterectomy   . Appendectomy   . Lumbar disc surgery     repair  . Retinal detachment surgery   . Cataract extraction     Family History  Problem Relation Age of Onset  . Cancer Mother   . Cancer Father   . Cancer Sister     breast cancer  . Diabetes Son   . Cancer Sister     lung cancer    History   Social History  . Marital Status: Widowed    Spouse Name: N/A    Number of Children: 4  . Years of Education: N/A   Occupational History  . retired     Educational psychologist industries   Social History Main Topics  . Smoking status: Former Smoker    Types: Cigarettes  . Smokeless tobacco: Not on file  . Alcohol Use: Not on file  . Drug Use: Not on file  . Sexually  Active: Not on file   Other Topics Concern  . Not on file   Social History Narrative   Hobbies: flowers, reading, genecology and sewing   Review of Systems Appetite is fair Weight stable Sleeps okay--better since granddaughter gone (would stay up late). Occ needs extra xanax at night Occ indigestion--occ pain up into shoulders    Objective:   Physical Exam  Constitutional: She appears well-developed. No distress.  Neck: Normal range of motion.  Cardiovascular: Normal rate and normal heart sounds.  Exam reveals no gallop.   No murmur heard.      Fairly regular with skips or slightly irregular  Pulmonary/Chest: Effort normal. No respiratory distress. She has no wheezes. She has no rales.       Decreased breath sounds but clear  Abdominal: Soft. There is no tenderness.  Musculoskeletal: She exhibits no edema and no tenderness.  Lymphadenopathy:    She has no cervical adenopathy.  Psychiatric: She has a normal mood and affect. Her behavior is normal.          Assessment & Plan:

## 2011-05-02 NOTE — Assessment & Plan Note (Signed)
Fairly severe Slight decline in functional status Discussed having some hired help at home Still qualifies for home Oxygen

## 2011-05-02 NOTE — Patient Instructions (Signed)
Hold today 5 mg daily except 2.5 mg T/ Th recheck 1 week

## 2011-05-04 ENCOUNTER — Other Ambulatory Visit: Payer: Self-pay | Admitting: *Deleted

## 2011-05-04 MED ORDER — ALPRAZOLAM 0.5 MG PO TABS: ORAL_TABLET | ORAL | Status: DC

## 2011-05-04 NOTE — Telephone Encounter (Signed)
Okay #45 x 0 

## 2011-05-04 NOTE — Telephone Encounter (Signed)
rx called into pharmacy

## 2011-05-09 ENCOUNTER — Ambulatory Visit (INDEPENDENT_AMBULATORY_CARE_PROVIDER_SITE_OTHER): Payer: Medicare Other | Admitting: Internal Medicine

## 2011-05-09 DIAGNOSIS — Z452 Encounter for adjustment and management of vascular access device: Secondary | ICD-10-CM

## 2011-05-09 DIAGNOSIS — Z7902 Long term (current) use of antithrombotics/antiplatelets: Secondary | ICD-10-CM

## 2011-05-09 DIAGNOSIS — Z7901 Long term (current) use of anticoagulants: Secondary | ICD-10-CM

## 2011-05-09 DIAGNOSIS — Z5181 Encounter for therapeutic drug level monitoring: Secondary | ICD-10-CM

## 2011-05-09 DIAGNOSIS — I4891 Unspecified atrial fibrillation: Secondary | ICD-10-CM

## 2011-05-09 NOTE — Patient Instructions (Signed)
5 mg daily except 2.5 mg T/ Th/Sat  recheck 2 week ( reduced 2.5 mg weekly)

## 2011-05-23 ENCOUNTER — Ambulatory Visit (INDEPENDENT_AMBULATORY_CARE_PROVIDER_SITE_OTHER): Payer: Medicare Other | Admitting: Internal Medicine

## 2011-05-23 DIAGNOSIS — Z5181 Encounter for therapeutic drug level monitoring: Secondary | ICD-10-CM

## 2011-05-23 DIAGNOSIS — Z7902 Long term (current) use of antithrombotics/antiplatelets: Secondary | ICD-10-CM

## 2011-05-23 DIAGNOSIS — I4891 Unspecified atrial fibrillation: Secondary | ICD-10-CM

## 2011-05-23 LAB — POCT INR: INR: 2.1

## 2011-05-23 NOTE — Patient Instructions (Signed)
Continue 5 mg daily except 2.5 mg T/ Th/Sat  recheck  4 week

## 2011-05-29 ENCOUNTER — Other Ambulatory Visit: Payer: Self-pay

## 2011-05-29 MED ORDER — ALBUTEROL SULFATE HFA 108 (90 BASE) MCG/ACT IN AERS: 2.0000 | INHALATION_SPRAY | RESPIRATORY_TRACT | Status: DC | PRN

## 2011-05-29 NOTE — Telephone Encounter (Signed)
[name] said [name] [name] would not refill pts combivent because too soon to refill. I spoke with [name] at [name] and pt received Combivent 2 weeks ago that would be for 200 puffs.  Presently pts instructions are 2 puffs into lungs 4 times a day. [name] [name] is not sure how often pt is using but said pt is not in distress now but if pt cannot get refill she will be in distress. [name] [name] said pts breathing is getting worse. Pt last seen 05/02/11. Please advise. [name] [name] can be reached at [phone].

## 2011-05-29 NOTE — Telephone Encounter (Signed)
She needs to have Rx for albuterol to use when she needs extra puffs combivent should NOT be taken more than 4 times a day If she doesn't already have albuterol HFA 2 puffs 4 times a day prn===please send Rx #1 x 5. She can use this inbetween the combivent Have the pharmacy fill early and explain to patient that combivent is only 8 per day

## 2011-05-29 NOTE — Telephone Encounter (Signed)
Spoke with daughter who is upset and confused as to why the insurance will not pay for the combivent and what's the difference in the combivent and albuterol and why can't the instructions be changed, per daughter? I spoke with April at [facility] and the insurance will not pay until Saturday unless dr. Alphonsus Sias changes the sig to more than 8 puffs a day. Daughter states pt is having problems breathing and using the inhaler more. I advised that [facility] is filling the albuterol but daughter doesn't think it will work and also she wants it delivered to her moms home. I advised I didn't know about delivery. Please advise

## 2011-05-30 ENCOUNTER — Other Ambulatory Visit: Payer: Self-pay | Admitting: *Deleted

## 2011-05-30 DIAGNOSIS — J449 Chronic obstructive pulmonary disease, unspecified: Secondary | ICD-10-CM

## 2011-05-30 MED ORDER — LEVALBUTEROL HCL 0.31 MG/3ML IN NEBU: 0.3100 mg | INHALATION_SOLUTION | RESPIRATORY_TRACT | Status: DC | PRN

## 2011-05-30 NOTE — Telephone Encounter (Signed)
Please explain that the combivent is 2 meds---albuterol and ipratropium. The albuterol can be given more frequently than 4 times a day but not the ipratropium. That is why she should regularly take the combivent 2 puffs four times a day---but only use the plain albuterol inbetween. They can fill the combivent before the weekend but would have to pay full price. She probably will be fine with just the albuterol for now--that is the part that gives quick relief with shortness of breath

## 2011-05-30 NOTE — Telephone Encounter (Signed)
Received call from Selena Batten at [facility], she seen the patient today for home accessment, and discussed and slowly went over the reason why she can't use so much of the combivent. Kim also made pt a schedule for combivent and albuterol and asked pt to write on the schedule every time she uses the combivent. Per kim whenever pt get anxious or nervous, she feels like she's short of breath then she reaches for the combivent instead of the nebulizer because it takes to much per pt to use the nebulizer, kim explained to pt that when she needs extra puffs to use the albuterol. Also Selena Batten would like a order for home PT evaluation and treat faxed to her at [phone], she really thinks the pt could benefit from this.  FYI she will visit pt once a month

## 2011-05-31 NOTE — Telephone Encounter (Signed)
Order written. Please fax

## 2011-05-31 NOTE — Telephone Encounter (Signed)
Order faxed.

## 2011-06-08 ENCOUNTER — Telehealth: Payer: Self-pay | Admitting: *Deleted

## 2011-06-08 NOTE — Telephone Encounter (Signed)
That is fine with me Let the patient know of the change---that I usually use the albuterol and not the xopenex anyway  She can let us know if she has any problems after the change

## 2011-06-08 NOTE — Telephone Encounter (Signed)
Stacy from [facility] calling stating that pt's Xopenex was being ordered by [facility] and they aren't making any money on this by getting it this way for the pt. Per [place], pt can't afford because it's too expensive can they change to plain albuterol for the nebulizer? Please advise

## 2011-06-09 ENCOUNTER — Other Ambulatory Visit: Payer: Self-pay | Admitting: *Deleted

## 2011-06-09 MED ORDER — ALBUTEROL SULFATE (2.5 MG/3ML) 0.083% IN NEBU: INHALATION_SOLUTION | RESPIRATORY_TRACT | Status: DC

## 2011-06-09 NOTE — Telephone Encounter (Signed)
Pharmacy advised.  Meds list updated.  Patient advised.

## 2011-06-10 ENCOUNTER — Emergency Department: Payer: Self-pay | Admitting: *Deleted

## 2011-06-10 LAB — COMPREHENSIVE METABOLIC PANEL
Alkaline Phosphatase: 60 U/L (ref 50–136)
Anion Gap: 8 (ref 7–16)
Calcium, Total: 9 mg/dL (ref 8.5–10.1)
Chloride: 102 mmol/L (ref 98–107)
Co2: 30 mmol/L (ref 21–32)
Creatinine: 1.14 mg/dL (ref 0.60–1.30)
EGFR (African American): 50 — ABNORMAL LOW
SGOT(AST): 29 U/L (ref 15–37)
SGPT (ALT): 20 U/L
Sodium: 140 mmol/L (ref 136–145)

## 2011-06-10 LAB — CBC
HCT: 38.7 % (ref 35.0–47.0)
HGB: 12.5 g/dL (ref 12.0–16.0)
MCH: 27.1 pg (ref 26.0–34.0)
MCHC: 32.4 g/dL (ref 32.0–36.0)
MCV: 84 fL (ref 80–100)
RBC: 4.62 10*6/uL (ref 3.80–5.20)
WBC: 6 10*3/uL (ref 3.6–11.0)

## 2011-06-10 LAB — TROPONIN I: Troponin-I: <0.02 ng/mL

## 2011-06-10 LAB — CK TOTAL AND CKMB (NOT AT ARMC): CK-MB: 1.8 ng/mL (ref 0.5–3.6)

## 2011-06-10 LAB — PRO B NATRIURETIC PEPTIDE: B-Type Natriuretic Peptide: 841 pg/mL — ABNORMAL HIGH (ref 0–450)

## 2011-06-15 ENCOUNTER — Other Ambulatory Visit: Payer: Self-pay | Admitting: *Deleted

## 2011-06-15 MED ORDER — ALPRAZOLAM 0.5 MG PO TABS: ORAL_TABLET | ORAL | Status: DC

## 2011-06-15 NOTE — Telephone Encounter (Signed)
Okay #45 x 1 

## 2011-06-15 NOTE — Telephone Encounter (Signed)
rx called into pharmacy

## 2011-06-19 ENCOUNTER — Telehealth: Payer: Self-pay | Admitting: *Deleted

## 2011-06-19 DIAGNOSIS — J449 Chronic obstructive pulmonary disease, unspecified: Secondary | ICD-10-CM

## 2011-06-19 NOTE — Telephone Encounter (Signed)
Please see if insurance will cover this Without a hospitalization and home health after, I am not sure this will be covered

## 2011-06-19 NOTE — Telephone Encounter (Signed)
would like an order for home health aid, pt has been diagnosed with bronchitis and it's really bad, started on Levaquin, pt went to [facility] ED. Per [facility] pt really needs help.

## 2011-06-20 ENCOUNTER — Ambulatory Visit (INDEPENDENT_AMBULATORY_CARE_PROVIDER_SITE_OTHER): Payer: Medicare Other | Admitting: Internal Medicine

## 2011-06-20 DIAGNOSIS — I4891 Unspecified atrial fibrillation: Secondary | ICD-10-CM

## 2011-06-20 DIAGNOSIS — Z7902 Long term (current) use of antithrombotics/antiplatelets: Secondary | ICD-10-CM

## 2011-06-20 DIAGNOSIS — Z5181 Encounter for therapeutic drug level monitoring: Secondary | ICD-10-CM

## 2011-06-20 NOTE — Telephone Encounter (Signed)
After meeting with the patients daughter, the family doesn't want a HH aide as they have already got a RN coming out 3 days a week to help their mother. I have cancelled the referral for [facility] aide.

## 2011-06-20 NOTE — Patient Instructions (Signed)
Continue current dose, check in 4 weeks  

## 2011-06-26 DIAGNOSIS — I509 Heart failure, unspecified: Secondary | ICD-10-CM

## 2011-06-26 DIAGNOSIS — IMO0001 Reserved for inherently not codable concepts without codable children: Secondary | ICD-10-CM

## 2011-06-26 DIAGNOSIS — R269 Unspecified abnormalities of gait and mobility: Secondary | ICD-10-CM

## 2011-06-26 DIAGNOSIS — I2510 Atherosclerotic heart disease of native coronary artery without angina pectoris: Secondary | ICD-10-CM

## 2011-06-26 DIAGNOSIS — I10 Essential (primary) hypertension: Secondary | ICD-10-CM

## 2011-06-27 ENCOUNTER — Other Ambulatory Visit: Payer: Self-pay | Admitting: *Deleted

## 2011-06-27 MED ORDER — WARFARIN SODIUM 5 MG PO TABS: 5.0000 mg | ORAL_TABLET | Freq: Every day | ORAL | Status: DC

## 2011-06-27 NOTE — Telephone Encounter (Signed)
Pharmacy says the old Combivent Inhalers are no longer available.  They are requesting a new Rx for the Combivent Respimat which is in the new formulation.  Please advise.

## 2011-06-28 MED ORDER — IPRATROPIUM-ALBUTEROL 20-100 MCG/ACT IN AERS: 1.0000 | INHALATION_SPRAY | Freq: Four times a day (QID) | RESPIRATORY_TRACT | Status: DC | PRN

## 2011-06-28 NOTE — Telephone Encounter (Signed)
It's CFC free and still liquid, please advise on instructions

## 2011-06-28 NOTE — Telephone Encounter (Signed)
Should be using 3-4 times per day regularly #120 x 11

## 2011-06-28 NOTE — Telephone Encounter (Signed)
rx sent to pharmacy by e-script  

## 2011-06-28 NOTE — Telephone Encounter (Signed)
That is fine Find out what type of delivery that is (?dry powder or what?)

## 2011-07-13 ENCOUNTER — Encounter: Payer: Self-pay | Admitting: Internal Medicine

## 2011-07-13 ENCOUNTER — Ambulatory Visit (INDEPENDENT_AMBULATORY_CARE_PROVIDER_SITE_OTHER): Payer: Medicare Other | Admitting: Internal Medicine

## 2011-07-13 VITALS — BP 122/68 | HR 86 | Temp 98.1°F | Resp 20 | Wt 116.0 lb

## 2011-07-13 DIAGNOSIS — I4891 Unspecified atrial fibrillation: Secondary | ICD-10-CM

## 2011-07-13 DIAGNOSIS — I5032 Chronic diastolic (congestive) heart failure: Secondary | ICD-10-CM

## 2011-07-13 DIAGNOSIS — J441 Chronic obstructive pulmonary disease with (acute) exacerbation: Secondary | ICD-10-CM

## 2011-07-13 NOTE — Assessment & Plan Note (Signed)
Rate is fine Still on the coumadin

## 2011-07-13 NOTE — Assessment & Plan Note (Signed)
Not active Neutral fluid status Doesn't seem to be the cause of the dyspnea

## 2011-07-13 NOTE — Patient Instructions (Signed)
Please start the advair at 1 puff twice a day. Use the spacer for this and all inhalers. Rinse your mouth after If you are not better in 3-4 days, increase the advair to 2 puffs twice a day

## 2011-07-13 NOTE — Progress Notes (Signed)
Subjective:    Patient ID: [name] [name], female    DOB: 1920-05-20, 78 y.o.   MRN: [id number]  HPI Here with daughter Ongoing SOB if she walks at all Now worse over the past week---can be gasping for breath from living room to bathroom Uses nebulizer bid as well as combivent prn or albuterol  No fever Only occ cough--not really productive  Had some left chest pain last week--- 3 sharp jabs which were brief No palpitations No edema Lies flat to sleep---no PND  Current Outpatient Prescriptions on File Prior to Visit  Medication Sig Dispense Refill  . albuterol (PROVENTIL HFA;VENTOLIN HFA) 108 (90 BASE) MCG/ACT inhaler Inhale 2 puffs into the lungs every 4 (four) hours as needed.  8.5 g  5  . albuterol (PROVENTIL) (2.5 MG/3ML) 0.083% nebulizer solution Take 3 mLs (2.5 mg total) by nebulization every 4 (four) hours as needed for wheezing.  150 mL  6  . ALPRAZolam (XANAX) 0.5 MG tablet Take 1 1/2 tablet by mouth at bedtime as needed  45 tablet  1  . calcium-vitamin D (OSCAL) 250-125 MG-UNIT per tablet Take 1 tablet by mouth 2 (two) times daily.        . digoxin (LANOXIN) 0.125 MG tablet Take 1 tablet (125 mcg total) by mouth daily.  30 tablet  11  . fish oil-omega-3 fatty acids 1000 MG capsule Take 2 g by mouth daily.        . furosemide (LASIX) 40 MG tablet Take 20 mg by mouth daily.        . Ipratropium-Albuterol (COMBIVENT RESPIMAT) 20-100 MCG/ACT AERS respimat Inhale 1 puff into the lungs every 6 (six) hours as needed for wheezing or shortness of breath.  4 g  11  . metoprolol succinate (TOPROL-XL) 25 MG 24 hr tablet Take 1 tablet (25 mg total) by mouth daily.  30 tablet  11  . Multiple Vitamin (MULTIVITAMIN) capsule Take 1 capsule by mouth daily.        . nitroGLYCERIN (NITROSTAT) 0.4 MG SL tablet Place 1 tablet (0.4 mg total) under the tongue every 5 (five) minutes as needed.  25 tablet  2  . potassium chloride (KLOR-CON) 10 MEQ CR tablet Take 1 tablet (10 mEq total) by mouth daily.   30 tablet  11  . traZODone (DESYREL) 50 MG tablet Take 1 tablet (50 mg total) by mouth at bedtime.  30 tablet  11  . vitamin B-12 (CYANOCOBALAMIN) 1000 MCG tablet Take 1,000 mcg by mouth daily.        Marland Kitchen warfarin (COUMADIN) 5 MG tablet Take 1 tablet (5 mg total) by mouth daily.  30 tablet  5    Allergies  Allergen Reactions  . Clindamycin     REACTION: rash  . Codeine Phosphate     REACTION: unspecified  . Prednisone     REACTION: Not clear cut--may not be allergic  . Tramadol Hcl     REACTION: visual hallucinations    Past Medical History  Diagnosis Date  . Anxiety   . Hypertension   . Osteoporosis   . COPD (chronic obstructive pulmonary disease)   . CHF (congestive heart failure)     diastolic  . Atrial fibrillation   . History of colon polyps   . Peripheral vascular disease   . Renal insufficiency   . Constipation   . Seizure   . PERIPHERAL VASCULAR DISEASE 09/26/2006  . GERD (gastroesophageal reflux disease)     Past Surgical History  Procedure Date  .  Abdominal hysterectomy   . Appendectomy   . Lumbar disc surgery     repair  . Retinal detachment surgery   . Cataract extraction     Family History  Problem Relation Age of Onset  . Cancer Mother   . Cancer Father   . Cancer Sister     breast cancer  . Diabetes Son   . Cancer Sister     lung cancer    History   Social History  . Marital Status: Widowed    Spouse Name: N/A    Number of Children: 4  . Years of Education: N/A   Occupational History  . retired     Educational psychologist industries   Social History Main Topics  . Smoking status: Former Smoker    Types: Cigarettes  . Smokeless tobacco: Not on file  . Alcohol Use: Not on file  . Drug Use: Not on file  . Sexually Active: Not on file   Other Topics Concern  . Not on file   Social History Narrative   Hobbies: flowers, reading, genecology and sewing   Review of Systems Appetite is off Weight is down slightly Has kept airconditioning on       Objective:   Physical Exam  Constitutional: She appears well-developed and well-nourished. No distress.  Neck: Normal range of motion. Neck supple.  Cardiovascular: Normal rate and normal heart sounds.  Exam reveals no gallop.   No murmur heard.      Irregular rhythm  Pulmonary/Chest: Effort normal. No respiratory distress. She has no wheezes. She has no rales.       Decreased breath sounds but clear  Musculoskeletal: She exhibits no edema and no tenderness.  Lymphadenopathy:    She has no cervical adenopathy.  Psychiatric: She has a normal mood and affect. Her behavior is normal.          Assessment & Plan:

## 2011-07-13 NOTE — Assessment & Plan Note (Addendum)
Worse but no obvious infection ?related to heat Discussed her inhalers Will start advair hfa

## 2011-07-18 ENCOUNTER — Ambulatory Visit (INDEPENDENT_AMBULATORY_CARE_PROVIDER_SITE_OTHER): Payer: Medicare Other | Admitting: Internal Medicine

## 2011-07-18 DIAGNOSIS — Z7901 Long term (current) use of anticoagulants: Secondary | ICD-10-CM

## 2011-07-18 DIAGNOSIS — I4891 Unspecified atrial fibrillation: Secondary | ICD-10-CM

## 2011-07-18 DIAGNOSIS — Z5181 Encounter for therapeutic drug level monitoring: Secondary | ICD-10-CM

## 2011-07-18 NOTE — Patient Instructions (Signed)
Continue 5 mg daily except 2.5 mg T/ Th/Sat  recheck  4 week 

## 2011-07-20 ENCOUNTER — Telehealth: Payer: Self-pay | Admitting: *Deleted

## 2011-07-20 NOTE — Telephone Encounter (Signed)
Would like verbal order to continue physical therapy, if not she will be discharged today.

## 2011-07-20 NOTE — Telephone Encounter (Signed)
Okay to continue?

## 2011-07-20 NOTE — Telephone Encounter (Signed)
Spoke with Tresa Endo and advised results

## 2011-07-26 ENCOUNTER — Encounter: Payer: Self-pay | Admitting: Internal Medicine

## 2011-07-26 ENCOUNTER — Ambulatory Visit: Payer: Medicare Other | Admitting: Internal Medicine

## 2011-07-26 VITALS — BP 136/56 | HR 62 | Resp 18 | Wt 118.0 lb

## 2011-07-26 DIAGNOSIS — I5032 Chronic diastolic (congestive) heart failure: Secondary | ICD-10-CM

## 2011-07-26 DIAGNOSIS — I10 Essential (primary) hypertension: Secondary | ICD-10-CM

## 2011-07-26 DIAGNOSIS — J449 Chronic obstructive pulmonary disease, unspecified: Secondary | ICD-10-CM

## 2011-07-26 DIAGNOSIS — F411 Generalized anxiety disorder: Secondary | ICD-10-CM

## 2011-07-26 DIAGNOSIS — I4891 Unspecified atrial fibrillation: Secondary | ICD-10-CM

## 2011-07-26 NOTE — Progress Notes (Signed)
Subjective:    Patient ID: [name] [name], female    DOB: 19-Apr-1920, 78 y.o.   MRN: [id number]  HPI Daughter [name] here  Breathing may be some better but not having a good day today Not feeling great No fever though Not much cough now  Nerves do act up at times May be due to dyspnea--or vice versa Does get better with 1/2 xanax  No chest pain No palpitations No syncope.  Had dizzy spell this AM and felt shaky. Seemed to clear up with meclizine Not much edema--but can't find support hose  Current Outpatient Prescriptions on File Prior to Visit  Medication Sig Dispense Refill  . albuterol (PROVENTIL HFA;VENTOLIN HFA) 108 (90 BASE) MCG/ACT inhaler Inhale 2 puffs into the lungs every 4 (four) hours as needed.  8.5 g  5  . albuterol (PROVENTIL) (2.5 MG/3ML) 0.083% nebulizer solution Take 3 mLs (2.5 mg total) by nebulization every 4 (four) hours as needed for wheezing.  150 mL  6  . calcium-vitamin D (OSCAL) 250-125 MG-UNIT per tablet Take 1 tablet by mouth 2 (two) times daily.        . digoxin (LANOXIN) 0.125 MG tablet Take 1 tablet (125 mcg total) by mouth daily.  30 tablet  11  . fish oil-omega-3 fatty acids 1000 MG capsule Take 2 g by mouth daily.        . fluticasone-salmeterol (ADVAIR HFA) 115-21 MCG/ACT inhaler Inhale 1 puff into the lungs 2 (two) times daily.      . furosemide (LASIX) 40 MG tablet Take 20 mg by mouth daily.        . Ipratropium-Albuterol (COMBIVENT RESPIMAT) 20-100 MCG/ACT AERS respimat Inhale 1 puff into the lungs every 6 (six) hours as needed for wheezing or shortness of breath.  4 g  11  . metoprolol succinate (TOPROL-XL) 25 MG 24 hr tablet Take 1 tablet (25 mg total) by mouth daily.  30 tablet  11  . Multiple Vitamin (MULTIVITAMIN) capsule Take 1 capsule by mouth daily.        . nitroGLYCERIN (NITROSTAT) 0.4 MG SL tablet Place 1 tablet (0.4 mg total) under the tongue every 5 (five) minutes as needed.  25 tablet  2  . potassium chloride (KLOR-CON) 10 MEQ CR  tablet Take 1 tablet (10 mEq total) by mouth daily.  30 tablet  11  . vitamin B-12 (CYANOCOBALAMIN) 1000 MCG tablet Take 1,000 mcg by mouth daily.        Marland Kitchen warfarin (COUMADIN) 5 MG tablet Take 1 tablet (5 mg total) by mouth daily.  30 tablet  5    Allergies  Allergen Reactions  . Clindamycin     REACTION: rash  . Codeine Phosphate     REACTION: unspecified  . Tramadol Hcl     REACTION: visual hallucinations  . Prednisone     Not allergic Can take but prefers not to--causes emotional changes    Past Medical History  Diagnosis Date  . Anxiety   . Hypertension   . Osteoporosis   . COPD (chronic obstructive pulmonary disease)   . CHF (congestive heart failure)     diastolic  . Atrial fibrillation   . History of colon polyps   . Peripheral vascular disease   . Renal insufficiency   . Constipation   . Seizure   . PERIPHERAL VASCULAR DISEASE 09/26/2006  . GERD (gastroesophageal reflux disease)     Past Surgical History  Procedure Date  . Abdominal hysterectomy   . Appendectomy   .  Lumbar disc surgery     repair  . Retinal detachment surgery   . Cataract extraction     Family History  Problem Relation Age of Onset  . Cancer Mother   . Cancer Father   . Cancer Sister     breast cancer  . Diabetes Son   . Cancer Sister     lung cancer    History   Social History  . Marital Status: Widowed    Spouse Name: N/A    Number of Children: 4  . Years of Education: N/A   Occupational History  . retired     Educational psychologist industries   Social History Main Topics  . Smoking status: Former Smoker    Types: Cigarettes  . Smokeless tobacco: Not on file  . Alcohol Use: Not on file  . Drug Use: Not on file  . Sexually Active: Not on file   Other Topics Concern  . Not on file   Social History Narrative   Hobbies: flowers, reading, genecology and sewing   Review of Systems Appetite fair Weight fairly stable Sleeps fair---trazodone didn't help but alprazolam does  help    Objective:   Physical Exam  Constitutional: She appears well-developed and well-nourished. No distress.  Neck: Normal range of motion. Neck supple.  Cardiovascular: Normal rate and normal heart sounds.  Exam reveals no gallop.   No murmur heard.      irregular  Pulmonary/Chest: Effort normal. No respiratory distress. She has no wheezes. She has rales.       Decreased breath sounds Dry crackles bilateral bases  Abdominal: Soft. There is no tenderness.  Musculoskeletal: She exhibits no edema and no tenderness.  Lymphadenopathy:    She has no cervical adenopathy.  Psychiatric: She has a normal mood and affect. Her behavior is normal.          Assessment & Plan:

## 2011-07-26 NOTE — Assessment & Plan Note (Signed)
Good rate control with dig and metoprolol Maintains on coumadin

## 2011-07-26 NOTE — Assessment & Plan Note (Signed)
BP Readings from Last 3 Encounters:  07/26/11 136/56  07/13/11 122/68  05/02/11 138/60    Good control  No changes needed

## 2011-07-26 NOTE — Assessment & Plan Note (Signed)
Doing okay with the alprazolam Uses for sleep and during the day if nerves/dyspnea act up

## 2011-07-26 NOTE — Assessment & Plan Note (Signed)
Seems better than 2 weeks ago, though today not a great day (but this is more because of vertigo) ?advair may be helping Will continue

## 2011-07-26 NOTE — Assessment & Plan Note (Signed)
Doing okay Neutral fluid status No changes

## 2011-08-01 ENCOUNTER — Other Ambulatory Visit: Payer: Self-pay | Admitting: Internal Medicine

## 2011-08-14 ENCOUNTER — Ambulatory Visit (INDEPENDENT_AMBULATORY_CARE_PROVIDER_SITE_OTHER): Payer: Medicare Other | Admitting: Internal Medicine

## 2011-08-14 DIAGNOSIS — Z5181 Encounter for therapeutic drug level monitoring: Secondary | ICD-10-CM

## 2011-08-14 DIAGNOSIS — I4891 Unspecified atrial fibrillation: Secondary | ICD-10-CM

## 2011-08-14 DIAGNOSIS — Z7902 Long term (current) use of antithrombotics/antiplatelets: Secondary | ICD-10-CM

## 2011-08-14 LAB — POCT INR: INR: 2.1

## 2011-08-14 NOTE — Patient Instructions (Signed)
Continue current dose, check in 4 weeks  

## 2011-08-15 ENCOUNTER — Ambulatory Visit: Payer: Medicare Other

## 2011-08-24 ENCOUNTER — Telehealth: Payer: Self-pay | Admitting: *Deleted

## 2011-08-24 NOTE — Telephone Encounter (Signed)
Order given to Selena Batten in person, she came into the office

## 2011-08-24 NOTE — Telephone Encounter (Signed)
[facility] asking for order for portable machine to check O2 stats, pt did have personal private aid, but she fired her and needs to check O2 on a regular basics. [facility] only comes every 2 weeks. Please advise. Fax# for order is [phone] and they will see if Medicare will pay for this.

## 2011-08-24 NOTE — Telephone Encounter (Signed)
Rx written Okay to fax They are not that expensive anymore--if it isn't paid for maybe she or family can buy one

## 2011-08-25 ENCOUNTER — Other Ambulatory Visit: Payer: Self-pay | Admitting: *Deleted

## 2011-08-26 NOTE — Telephone Encounter (Signed)
Okay #60 x 1 

## 2011-08-28 MED ORDER — ALPRAZOLAM 0.5 MG PO TABS: ORAL_TABLET | ORAL | Status: DC

## 2011-08-28 NOTE — Telephone Encounter (Signed)
rx called into pharmacy

## 2011-09-11 ENCOUNTER — Ambulatory Visit (INDEPENDENT_AMBULATORY_CARE_PROVIDER_SITE_OTHER): Payer: Medicare Other | Admitting: Internal Medicine

## 2011-09-11 DIAGNOSIS — Z7902 Long term (current) use of antithrombotics/antiplatelets: Secondary | ICD-10-CM

## 2011-09-11 DIAGNOSIS — I4891 Unspecified atrial fibrillation: Secondary | ICD-10-CM

## 2011-09-11 DIAGNOSIS — Z5181 Encounter for therapeutic drug level monitoring: Secondary | ICD-10-CM

## 2011-09-11 LAB — POCT INR: INR: 1.5

## 2011-09-11 NOTE — Patient Instructions (Signed)
Continue 5 mg daily except 2.5 mg T/ Th/Sat  recheck  1 week  No changes in dose made today, patient has been eating greens

## 2011-09-13 ENCOUNTER — Inpatient Hospital Stay: Payer: Self-pay | Admitting: Specialist

## 2011-09-13 LAB — CBC WITH DIFFERENTIAL/PLATELET
Basophil #: 0.1 10*3/uL (ref 0.0–0.1)
Basophil %: 0.7 %
Eosinophil #: 0.1 10*3/uL (ref 0.0–0.7)
HCT: 41.8 % (ref 35.0–47.0)
Lymphocyte #: 1.1 10*3/uL (ref 1.0–3.6)
Lymphocyte %: 9.8 %
MCH: 27.4 pg (ref 26.0–34.0)
MCHC: 33 g/dL (ref 32.0–36.0)
Monocyte #: 0.6 x10 3/mm (ref 0.2–0.9)
Neutrophil #: 9.4 10*3/uL — ABNORMAL HIGH (ref 1.4–6.5)
RDW: 15.9 % — ABNORMAL HIGH (ref 11.5–14.5)

## 2011-09-13 LAB — COMPREHENSIVE METABOLIC PANEL
Bilirubin,Total: 0.4 mg/dL (ref 0.2–1.0)
Chloride: 99 mmol/L (ref 98–107)
Co2: 32 mmol/L (ref 21–32)
Creatinine: 1.28 mg/dL (ref 0.60–1.30)
EGFR (African American): 43 — ABNORMAL LOW
EGFR (Non-African Amer.): 37 — ABNORMAL LOW
SGOT(AST): 27 U/L (ref 15–37)
SGPT (ALT): 23 U/L

## 2011-09-13 LAB — DIGOXIN LEVEL: Digoxin: 1.29 ng/mL

## 2011-09-13 LAB — PROTIME-INR
INR: 1.3
Prothrombin Time: 16.6 secs — ABNORMAL HIGH (ref 11.5–14.7)

## 2011-09-14 LAB — CBC WITH DIFFERENTIAL/PLATELET
Basophil %: 0.1 %
HGB: 12 g/dL (ref 12.0–16.0)
Lymphocyte %: 3.3 %
MCHC: 33.2 g/dL (ref 32.0–36.0)
Monocyte #: 0.4 x10 3/mm (ref 0.2–0.9)
Monocyte %: 2.3 %
Neutrophil %: 94.2 %
RDW: 15.8 % — ABNORMAL HIGH (ref 11.5–14.5)
WBC: 16.6 10*3/uL — ABNORMAL HIGH (ref 3.6–11.0)

## 2011-09-14 LAB — BASIC METABOLIC PANEL
Calcium, Total: 9.2 mg/dL (ref 8.5–10.1)
Chloride: 102 mmol/L (ref 98–107)
Co2: 27 mmol/L (ref 21–32)
Osmolality: 288 (ref 275–301)
Potassium: 4.4 mmol/L (ref 3.5–5.1)

## 2011-09-14 LAB — PROTIME-INR
INR: 1.7
Prothrombin Time: 20.5 secs — ABNORMAL HIGH (ref 11.5–14.7)

## 2011-09-15 LAB — BASIC METABOLIC PANEL
BUN: 31 mg/dL — ABNORMAL HIGH (ref 7–18)
Calcium, Total: 8.8 mg/dL (ref 8.5–10.1)
Chloride: 98 mmol/L (ref 98–107)
Creatinine: 1.37 mg/dL — ABNORMAL HIGH (ref 0.60–1.30)
EGFR (Non-African Amer.): 34 — ABNORMAL LOW
Potassium: 4.1 mmol/L (ref 3.5–5.1)
Sodium: 135 mmol/L — ABNORMAL LOW (ref 136–145)

## 2011-09-15 LAB — PROTIME-INR
INR: 2.8
Prothrombin Time: 29.6 secs — ABNORMAL HIGH (ref 11.5–14.7)

## 2011-09-16 LAB — BASIC METABOLIC PANEL
Anion Gap: 9 (ref 7–16)
Calcium, Total: 8.9 mg/dL (ref 8.5–10.1)
Chloride: 103 mmol/L (ref 98–107)
Co2: 30 mmol/L (ref 21–32)
Creatinine: 1.09 mg/dL (ref 0.60–1.30)
EGFR (African American): 52 — ABNORMAL LOW
Osmolality: 294 (ref 275–301)

## 2011-09-17 LAB — BASIC METABOLIC PANEL
Anion Gap: 9 (ref 7–16)
BUN: 26 mg/dL — ABNORMAL HIGH (ref 7–18)
Creatinine: 0.96 mg/dL (ref 0.60–1.30)
EGFR (African American): >60
EGFR (Non-African Amer.): 53 — ABNORMAL LOW
Glucose: 88 mg/dL (ref 65–99)
Sodium: 142 mmol/L (ref 136–145)

## 2011-09-17 LAB — PROTIME-INR: Prothrombin Time: 33.9 secs — ABNORMAL HIGH (ref 11.5–14.7)

## 2011-09-18 ENCOUNTER — Telehealth: Payer: Self-pay | Admitting: *Deleted

## 2011-09-18 ENCOUNTER — Ambulatory Visit: Payer: Medicare Other

## 2011-09-18 LAB — CULTURE, BLOOD (SINGLE)

## 2011-09-18 NOTE — Telephone Encounter (Signed)
[name]  Per nurse from [facility] pt just released from [facility] yesterday 09/17/11, pt was seen for COPD exacerbation and pneumonia, pt put on Ceftin also. Family would like to discuss DNR with Dr.Letvak ( I advised nurse he was out of the office) per nurse pt due for PT/INR to be checked on Friday, ok to check? Nurse will be seeing the pt for the next 9 weeks. Please advise if ok to check coumadin.  I will send for records from [facility].

## 2011-09-18 NOTE — Telephone Encounter (Signed)
Spoke with [name] and advised ok to check pt/inr on Friday.   [facility] note also scanned in

## 2011-09-18 NOTE — Telephone Encounter (Signed)
Please check PT/INR on friday

## 2011-09-19 ENCOUNTER — Telehealth: Payer: Self-pay

## 2011-09-19 LAB — CULTURE, BLOOD (SINGLE)

## 2011-09-19 MED ORDER — AMOXICILLIN-POT CLAVULANATE 875-125 MG PO TABS: 1.0000 | ORAL_TABLET | Freq: Two times a day (BID) | ORAL | Status: AC

## 2011-09-19 NOTE — Telephone Encounter (Signed)
Noted.  Appears that she tolerated Augmentin ok in past.  Will send rx for Augmentin to [facility].

## 2011-09-19 NOTE — Telephone Encounter (Signed)
Pt discharged [facility] on 09/27/11 with pneumonia; Given Cefuroxime Axetil 500 mg taking one tab twice a day. Pt taking with food but has N&V x 1 last night after taking med with diarrhea and stomach pain. No fever. Request different antibiotic to [facility].Please advise.

## 2011-09-19 NOTE — Telephone Encounter (Signed)
Spoke with daughter and advised results.  

## 2011-09-21 ENCOUNTER — Telehealth: Payer: Self-pay | Admitting: Internal Medicine

## 2011-09-21 MED ORDER — FLUTICASONE-SALMETEROL 115-21 MCG/ACT IN AERO: 1.0000 | INHALATION_SPRAY | Freq: Two times a day (BID) | RESPIRATORY_TRACT | Status: DC

## 2011-09-21 NOTE — Telephone Encounter (Signed)
Patient's daughter said that she has had pain in her left side and chest pain on and off.  Patient's daughter said the left side was where she had the pneumonia last week.  Patient's daughter wanted to know when you can come for a home visit.

## 2011-09-21 NOTE — Addendum Note (Signed)
Addended by: Tillman Abide I on: 09/21/2011 01:54 PM   Modules accepted: Orders

## 2011-09-21 NOTE — Telephone Encounter (Signed)
Spoke to daughter Home health nurse had checked her and thought she was okay ---and is coming back tomorrow  Explained that I couldn't come out due to missing work and being out for a few days----can see her in office tomorrow afternoon

## 2011-09-22 ENCOUNTER — Ambulatory Visit: Payer: Medicare Other

## 2011-09-27 ENCOUNTER — Telehealth: Payer: Self-pay

## 2011-09-27 NOTE — Telephone Encounter (Signed)
Left message for nurse, advised her to call with questions.  Spoke with patient and advised results, pt would like to wait until her home visit to discuss the DNR.   I asked if the wounds on her foot and ankle where getting worse and she states no, the one on her right ankle has been there for 4 years, per pt it's like a "little hard corn", the one on the left ankle haven't been there long. Pt states she think it comes from her laying on her side in bed, where her foot and ankle rub the sheets? She will only make an appt if Dr.Letvak thinks it's necessary, if not she will wait for her home visit.

## 2011-09-27 NOTE — Telephone Encounter (Signed)
Okay for her to drop the combivent if she doesn't feel it helps Probably should use the albuterol inhaler or nebulizer at least a couple of times a day then  Okay to use OTC potassium instead of Rx  If she is interested in DNR, I can write this and mail it to her  I would probably prefer to see the wounds before making referral---okay to set up appt here as I don't think I will be out for home visit for at least another month  Speak to her about all these things also--not just the nurse

## 2011-09-27 NOTE — Telephone Encounter (Signed)
Amy nurse with [facility] left v/m;pt does not want to use Combivent;hard for pt to use.Pt also has albuterol inhaler, albuterol nebulizer and Advair; family wants to know if pt has to use Combivent. On discharge sheet pt to take K 10 meq; pt has been taking OTC K 99 mg. Should pt get K 10 meq rx filled or continue OTC K.? Family interested in DNR and when should pt to be seen? Pt also has wound on lateral left ankle;not open but draining; feels like hard callus but looks like blister;keeping clean and dry. On right 4 th toe dorsal aspect small wound 0.4 x 0.4. Should continue keep watch on 2 areas or referral to wound care at [facility].Please advise.

## 2011-09-28 NOTE — Telephone Encounter (Signed)
That sounds fine Sometimes the nurses jump the gun on things I should be able to get out there within the next month or so

## 2011-09-29 ENCOUNTER — Telehealth: Payer: Self-pay

## 2011-09-29 NOTE — Telephone Encounter (Signed)
Please continue the current dose and recheck in around 1 month

## 2011-09-29 NOTE — Telephone Encounter (Signed)
LM with results on VM, advised [facility] to return my call if any questions

## 2011-09-29 NOTE — Telephone Encounter (Signed)
Christina with [facility] called INR/PT report. INR 2.6 and PT 30.9 . Pt presently taking Warfarin 5 mg daily.Please advise.

## 2011-10-04 ENCOUNTER — Telehealth: Payer: Self-pay

## 2011-10-04 NOTE — Telephone Encounter (Signed)
Amy with [facility]; when does pt need next INR done?  Past 2 days pt had more SOB than usual; O2 sat  At 2.5 L 92-94%; when pt up moving around 87%. Abdomen is a bit distended; no leg swelling and lungs sound clear. Does Dr Alphonsus Sias want to increase Lasix for few days. Pt had 2 lb  Weight gain in past week. Toe and ankle wounds appear same; does pt need to be seen? Please advise.

## 2011-10-05 ENCOUNTER — Telehealth: Payer: Self-pay | Admitting: Internal Medicine

## 2011-10-05 MED ORDER — LEVOFLOXACIN 500 MG PO TABS: 500.0000 mg | ORAL_TABLET | Freq: Every day | ORAL | Status: DC

## 2011-10-05 NOTE — Telephone Encounter (Signed)
[name] [name], Physical Terapist, [facility], [name] [name], [birth year], Dr Alphonsus Sias, Ambulating O2 monitoring exercises on 8-22, Pt dropped to 82% on 2.  5L with difficulting breathing.  Pt complains of tightness in abdomen.  Pulmonologist referral requested.

## 2011-10-05 NOTE — Telephone Encounter (Signed)
She was supposed to have been rechecked already---have her draw the INR as soon as feasible  I would not increase the lasix unless the weight goes up more or she gets dyspnea  Check with Mrs. Decesare to see if she thinks I need to see her. I could go out next week

## 2011-10-05 NOTE — Telephone Encounter (Signed)
I am not sure the patient wants that Please call her and see how she is doing  I can see her next week if she thinks I need to come sooner than planned If she is interested in seeing a lung specialist, I can set her up with Dr Kendrick Fries in [place]

## 2011-10-05 NOTE — Telephone Encounter (Signed)
Spoke with daughter [name] and advised appt time at 12:15pm and that rx was sent to the pharmacy.

## 2011-10-05 NOTE — Telephone Encounter (Signed)
Please send Rx for levofloxacin 500mg   #10 1 daily  Add on tomorrow-- I have 8:45 (which is better) or 12:15 if she can't come in at the earlier time

## 2011-10-05 NOTE — Telephone Encounter (Signed)
Spoke with Amy and advised results, can check PT/INR at the office tomorrow on her OV

## 2011-10-05 NOTE — Telephone Encounter (Signed)
[name] pts daughter called; pt recently in hospital with pneumonia; pt starting with productive cough with thick yellow phlegm again,more difficulty breathing, [name] can hear rattling in lungs and some tightness in abdomen. ? Fever. [name] wants pt seen as soon as possible; [name] will bring to office if needed; does not want to wait until next week. [name] would like pulmonary appt with Dr Kendrick Fries in [place] also.[facility].Please advise.

## 2011-10-06 ENCOUNTER — Ambulatory Visit (INDEPENDENT_AMBULATORY_CARE_PROVIDER_SITE_OTHER): Payer: Medicare Other | Admitting: Internal Medicine

## 2011-10-06 ENCOUNTER — Encounter: Payer: Self-pay | Admitting: Internal Medicine

## 2011-10-06 VITALS — BP 118/70 | HR 79 | Temp 98.6°F | Ht 62.0 in | Wt 118.0 lb

## 2011-10-06 DIAGNOSIS — I4891 Unspecified atrial fibrillation: Secondary | ICD-10-CM

## 2011-10-06 DIAGNOSIS — Z5181 Encounter for therapeutic drug level monitoring: Secondary | ICD-10-CM

## 2011-10-06 DIAGNOSIS — J441 Chronic obstructive pulmonary disease with (acute) exacerbation: Secondary | ICD-10-CM

## 2011-10-06 DIAGNOSIS — Z7902 Long term (current) use of antithrombotics/antiplatelets: Secondary | ICD-10-CM

## 2011-10-06 MED ORDER — BUDESONIDE 0.5 MG/2ML IN SUSP: 0.5000 mg | Freq: Two times a day (BID) | RESPIRATORY_TRACT | Status: DC

## 2011-10-06 NOTE — Assessment & Plan Note (Signed)
Seems to be worse overall Can't tolerate prednisone Inhalers are hard to use and she doesn't seem to be able to get much from them Will continue the levofloxacin  Will change to just nebulized med----budesonide and albuterol Discussed hospice--she is agreeable to this after  I outlined all that can they can do

## 2011-10-06 NOTE — Patient Instructions (Signed)
Please stop all inhalers Use the albuterol nebulizer 4 times a day---and add the new budesonide to this for two of those treatments. You can use the albuterol up to 2 additional times if your breathing is not good

## 2011-10-06 NOTE — Patient Instructions (Signed)
Continue 5 mg daily, recheck 2 weeks (home health coming out)

## 2011-10-06 NOTE — Progress Notes (Signed)
Subjective:    Patient ID: [name] [name], female    DOB: 1920/10/03, 78 y.o.   MRN: [id number]  HPI Here with daughter  Has been having more trouble with her breathing on a general basis Getting SOB just sitting in chair Doing personal care before bedtime can be difficult  Anxiety has been building up Nerve pill does help then sometimes  Has been weaker Generally not feeling as well 2 days ago--didn't even have energy to even take clothes off at night Hypoxia noted by PT on her visits  More rattling and gurgling in chest 2 days ago---daughter noted Nurse corroborated this No fever Cough with clear sputum---turned yellow yesterday Did start levofloxacin yesterday  Current Outpatient Prescriptions on File Prior to Visit  Medication Sig Dispense Refill  . albuterol (PROVENTIL HFA;VENTOLIN HFA) 108 (90 BASE) MCG/ACT inhaler Inhale 2 puffs into the lungs every 4 (four) hours as needed.  8.5 g  5  . albuterol (PROVENTIL) (2.5 MG/3ML) 0.083% nebulizer solution Take 3 mLs (2.5 mg total) by nebulization every 4 (four) hours as needed for wheezing.  150 mL  6  . ALPRAZolam (XANAX) 0.5 MG tablet Take 1 tablet by mouth at bedtime and 1/2 tab twice daily prn for nerves  60 tablet  1  . calcium-vitamin D (OSCAL) 250-125 MG-UNIT per tablet Take 1 tablet by mouth 2 (two) times daily.        . digoxin (LANOXIN) 0.125 MG tablet Take 1 tablet (125 mcg total) by mouth daily.  30 tablet  11  . fish oil-omega-3 fatty acids 1000 MG capsule Take 2 g by mouth daily.        . fluticasone-salmeterol (ADVAIR HFA) 115-21 MCG/ACT inhaler Inhale 1 puff into the lungs 2 (two) times daily.  1 Inhaler  11  . furosemide (LASIX) 40 MG tablet Take 20 mg by mouth daily.        Marland Kitchen levofloxacin (LEVAQUIN) 500 MG tablet Take 1 tablet (500 mg total) by mouth daily.  10 tablet  0  . metoprolol succinate (TOPROL-XL) 25 MG 24 hr tablet Take 1 tablet (25 mg total) by mouth daily.  30 tablet  11  . Multiple Vitamin  (MULTIVITAMIN) capsule Take 1 capsule by mouth daily.        . nitroGLYCERIN (NITROSTAT) 0.4 MG SL tablet Place 1 tablet (0.4 mg total) under the tongue every 5 (five) minutes as needed.  25 tablet  2  . potassium chloride (KLOR-CON) 10 MEQ CR tablet Take 1 tablet (10 mEq total) by mouth daily.  30 tablet  11  . vitamin B-12 (CYANOCOBALAMIN) 1000 MCG tablet Take 1,000 mcg by mouth daily.        Marland Kitchen warfarin (COUMADIN) 5 MG tablet Take 1 tablet (5 mg total) by mouth daily.  30 tablet  5    Allergies  Allergen Reactions  . Cefuroxime     Abdominal discomfort and vomiting  . Clindamycin     REACTION: rash  . Codeine Phosphate     REACTION: unspecified  . Tramadol Hcl     REACTION: visual hallucinations  . Prednisone     Not allergic Can take but prefers not to--causes emotional changes    Past Medical History  Diagnosis Date  . Anxiety   . Hypertension   . Osteoporosis   . COPD (chronic obstructive pulmonary disease)   . CHF (congestive heart failure)     diastolic  . Atrial fibrillation   . History of colon polyps   .  Peripheral vascular disease   . Renal insufficiency   . Constipation   . Seizure   . PERIPHERAL VASCULAR DISEASE 09/26/2006  . GERD (gastroesophageal reflux disease)     Past Surgical History  Procedure Date  . Abdominal hysterectomy   . Appendectomy   . Lumbar disc surgery     repair  . Retinal detachment surgery   . Cataract extraction     Family History  Problem Relation Age of Onset  . Cancer Mother   . Cancer Father   . Cancer Sister     breast cancer  . Diabetes Son   . Cancer Sister     lung cancer    History   Social History  . Marital Status: Widowed    Spouse Name: N/A    Number of Children: 4  . Years of Education: N/A   Occupational History  . retired     Educational psychologist industries   Social History Main Topics  . Smoking status: Former Smoker    Types: Cigarettes  . Smokeless tobacco: Never Used  . Alcohol Use: Not on file    . Drug Use: Not on file  . Sexually Active: Not on file   Other Topics Concern  . Not on file   Social History Narrative   Hobbies: flowers, reading, genecology and sewing   Review of Systems Appetite off but eating some Generally sleeps okay---occ short naps     Objective:   Physical Exam  Constitutional: She appears well-developed and well-nourished. No distress.  Neck: Normal range of motion.  Cardiovascular: Normal rate.  Exam reveals no gallop.   No murmur heard.      irregular  Pulmonary/Chest: She is in respiratory distress. She has wheezes. She has no rales.       Markedly decreased breath sounds  Mild increased exp phase and mild scattered wheezes  Musculoskeletal: She exhibits no edema and no tenderness.  Lymphadenopathy:    She has no cervical adenopathy.          Assessment & Plan:

## 2011-10-10 DIAGNOSIS — J13 Pneumonia due to Streptococcus pneumoniae: Secondary | ICD-10-CM

## 2011-10-10 DIAGNOSIS — J441 Chronic obstructive pulmonary disease with (acute) exacerbation: Secondary | ICD-10-CM

## 2011-10-10 DIAGNOSIS — I5032 Chronic diastolic (congestive) heart failure: Secondary | ICD-10-CM

## 2011-10-10 DIAGNOSIS — I10 Essential (primary) hypertension: Secondary | ICD-10-CM

## 2011-10-12 ENCOUNTER — Telehealth: Payer: Self-pay

## 2011-10-12 NOTE — Telephone Encounter (Signed)
Okay to draw the protime The albuterol is four times a day---2 of those times will have the budesonide in it as well She can also get an additional 2 albuterol nebs during the day if she needs extra

## 2011-10-12 NOTE — Telephone Encounter (Signed)
[name] [name] with [facility] request order for protime. Pt on nebulizer treatments; is it OK for pt to use albuterol linhaler in between nebulizer treatments?

## 2011-10-13 NOTE — Telephone Encounter (Signed)
Spoke with hospice nurse and advised results  

## 2011-10-17 DIAGNOSIS — I5032 Chronic diastolic (congestive) heart failure: Secondary | ICD-10-CM

## 2011-10-17 DIAGNOSIS — J449 Chronic obstructive pulmonary disease, unspecified: Secondary | ICD-10-CM

## 2011-10-25 ENCOUNTER — Encounter: Payer: Self-pay | Admitting: Internal Medicine

## 2011-10-26 ENCOUNTER — Telehealth: Payer: Self-pay

## 2011-10-26 NOTE — Telephone Encounter (Signed)
[name] [name] with [facility]; Protime faxed to office 10/25/11; is there change in Coumadin dosage.Please advise.

## 2011-10-26 NOTE — Telephone Encounter (Signed)
Please call nurse and patient INR is high at 4.2 Please hold the coumadin for 2 days  Confirm that she was taking 5mg  daily If so, reduce dose to 2.5mg  daily except 5mg  on M/W/F Recheck protime in 1 week

## 2011-10-26 NOTE — Telephone Encounter (Signed)
Spoke with hospice nurse and advised results, she will call the daughter that does the patient's medications to make the changes. Nurse confirmed 5 mg daily and she will re-draw next Thursday.

## 2011-10-30 ENCOUNTER — Other Ambulatory Visit: Payer: Self-pay | Admitting: Internal Medicine

## 2011-10-30 NOTE — Telephone Encounter (Signed)
Hospice send urine this weekend We need to try to get the results before deciding if she needs more Rx

## 2011-10-30 NOTE — Telephone Encounter (Signed)
Ok to refill 

## 2011-10-31 NOTE — Telephone Encounter (Signed)
No, spoke with Chip Boer hospice nurse and [facility] lost the specimen, per Chip Boer pt is doing a lot better after taking the 3 days of Levaquin.

## 2011-10-31 NOTE — Telephone Encounter (Signed)
Any luck getting urinalysis report?

## 2011-11-01 MED ORDER — LEVOFLOXACIN 500 MG PO TABS: 500.0000 mg | ORAL_TABLET | Freq: Every day | ORAL | Status: DC

## 2011-11-01 NOTE — Telephone Encounter (Signed)
Go ahead and extend it to a total of 10 days then #7 additional  To finish full course in case she did have a UTI

## 2011-11-01 NOTE — Telephone Encounter (Signed)
Spoke with patient and advised results rx sent to pharmacy by e-script  

## 2011-11-02 LAB — PROTIME-INR

## 2011-11-06 ENCOUNTER — Telehealth: Payer: Self-pay | Admitting: Internal Medicine

## 2011-11-06 NOTE — Telephone Encounter (Signed)
Spoke with [facility] hospice nurse about results, also she would like to repeat the UA on Thursday because the daughters are concerned about the infection recurring. Please advise

## 2011-11-06 NOTE — Telephone Encounter (Signed)
Protime INR received from hospice--this should have been put right in a note  INR down to 1.3 now! Has really been fluctuating  Have them go back up to 5mg  daily and recheck in 2 weeks Let hospice nurse know

## 2011-11-06 NOTE — Telephone Encounter (Signed)
Okay to recheck Her last high INR may have been due to antibiotic Rx

## 2011-11-06 NOTE — Telephone Encounter (Signed)
Spoke with hospice nurse and advised results  

## 2011-11-06 NOTE — Telephone Encounter (Signed)
See note

## 2011-11-08 ENCOUNTER — Encounter: Payer: Self-pay | Admitting: Internal Medicine

## 2011-11-13 ENCOUNTER — Encounter: Payer: Self-pay | Admitting: Internal Medicine

## 2011-11-17 ENCOUNTER — Encounter: Payer: Self-pay | Admitting: Internal Medicine

## 2011-11-23 DIAGNOSIS — I4891 Unspecified atrial fibrillation: Secondary | ICD-10-CM

## 2011-11-23 DIAGNOSIS — J449 Chronic obstructive pulmonary disease, unspecified: Secondary | ICD-10-CM

## 2011-11-23 DIAGNOSIS — F29 Unspecified psychosis not due to a substance or known physiological condition: Secondary | ICD-10-CM

## 2011-11-27 ENCOUNTER — Telehealth: Payer: Self-pay

## 2011-11-27 NOTE — Telephone Encounter (Signed)
[name] [name] with [facility]; pts daughter called hospice said pt had confusion and forgetfullness over weekend; vitals unremarkable;daughter said last time pt had UTI  Pt got confused; [name] [name] got urine for urinalysis if Dr would like to order she will drop off at [facility]. Pts daughter also request home visit from Dr Alphonsus Sias. Please advise.

## 2011-11-28 ENCOUNTER — Telehealth: Payer: Self-pay

## 2011-11-28 MED ORDER — LEVOFLOXACIN 500 MG PO TABS: 500.0000 mg | ORAL_TABLET | Freq: Every day | ORAL | Status: DC

## 2011-11-28 NOTE — Telephone Encounter (Signed)
Crystal with hospice said they did a U/A and we should have received the results, but the U/A they did was neg. For everything but pt has a bad productive cough and a hx of pneumonia so family wanted a Rx for antibiotic as a precaution, Crystal said she is going to call tomorrow to schedule a home visit with Dr. Alphonsus Sias, but wanted her on some type of antibiotic till Dr. Alphonsus Sias can see her and pt is not on levaquin that was just a 5 day course

## 2011-11-28 NOTE — Telephone Encounter (Signed)
Ok, thanks  Please call in levaquin (Px written for call in  ) Keep [place] posted with how she is doing  Also - this will again affect protime/ INR - so make sure that is checked in the next 2-3 d, thanks

## 2011-11-28 NOTE — Telephone Encounter (Signed)
Left message with hospice nurse, advised her to call if any questions

## 2011-11-28 NOTE — Telephone Encounter (Signed)
Okay to send urine I will see about the home visit---may be able to add tomorrow

## 2011-11-28 NOTE — Telephone Encounter (Signed)
I do not see a urine result in the chart ... And I'm not the PCP -- is she needing an order for ua?  Also it looks like she was on levaquin last mo- is she still on that ?  I'm happy to px abx if pt and family wish not to take her to hospital (she is hospice pt) , but do want to cover infection  Let me know

## 2011-11-28 NOTE — Telephone Encounter (Signed)
Notified Crystal (hospice) that Rx was sent in and that her INR should be check in the nxt 2-3 days and to follow up with Dr. Alphonsus Sias as planned

## 2011-11-28 NOTE — Telephone Encounter (Signed)
Crystal with hospice called to see if prelimanery report for urine. Pt confusion started last Thur or Fri and pt is still confused; does not know her daughter or that pt is at home. Loung sound deminished left lower lobe with crackles and faint rales. Congested cough with yellow phlegm; pt family concerned about possible pneumonia. No fever. If urine is negative can prophylactic antibiotic be ordered? Crystal knows Dr Alphonsus Sias not in office today and wants sent to doctor who is in office. Please advise.

## 2011-11-29 ENCOUNTER — Encounter: Payer: Self-pay | Admitting: Internal Medicine

## 2011-11-29 ENCOUNTER — Ambulatory Visit: Payer: Medicare Other | Admitting: Internal Medicine

## 2011-11-29 VITALS — BP 140/66 | HR 54 | Resp 22 | Wt 119.0 lb

## 2011-11-29 DIAGNOSIS — F411 Generalized anxiety disorder: Secondary | ICD-10-CM

## 2011-11-29 DIAGNOSIS — R410 Disorientation, unspecified: Secondary | ICD-10-CM | POA: Insufficient documentation

## 2011-11-29 DIAGNOSIS — I10 Essential (primary) hypertension: Secondary | ICD-10-CM

## 2011-11-29 DIAGNOSIS — Z23 Encounter for immunization: Secondary | ICD-10-CM

## 2011-11-29 DIAGNOSIS — I4891 Unspecified atrial fibrillation: Secondary | ICD-10-CM

## 2011-11-29 DIAGNOSIS — J449 Chronic obstructive pulmonary disease, unspecified: Secondary | ICD-10-CM

## 2011-11-29 DIAGNOSIS — F29 Unspecified psychosis not due to a substance or known physiological condition: Secondary | ICD-10-CM

## 2011-11-29 NOTE — Assessment & Plan Note (Signed)
BP Readings from Last 3 Encounters:  11/29/11 140/66  10/06/11 118/70  07/26/11 136/56   Reasonable control May need to stop the metoprolol if heart rate stays low

## 2011-11-29 NOTE — Progress Notes (Signed)
Subjective:    Patient ID: [name] [name], female    DOB: 1920/11/25, 77 y.o.   MRN: [id number]  HPI Daughters are here Confusion noted last week---got severe per hospice nurse Urine checked---no signs of infection At times, doesn't think she is in her own home (though she recognizes her surroundings) Getting mixed up with names, etc  Started on levaquin this AM---empiric Rx because her breathing got worse Nurse yesterday felt her left lung sounded different Has been coughing some Patient still feels her mucus is clear---but occ gets lots of purulent stuff after longer coughing spell Wears the oxygen all the time  Has aide 4 hours per day--- 3 days per week Has not had incontinence in day---has occ had trouble finding the bathroom when getting up at night (but family helps her get there) [name] or [name] (daughters) spend the night with her At most has an hour without someone there with her  No focal weakness No facial droop, aphasia, dysphagia Occ has sensation of head feeling strange---daughters note more confusion then Nerves have not been a problem Sleeps okay---does take the xanax every night  Current Outpatient Prescriptions on File Prior to Visit  Medication Sig Dispense Refill  . albuterol (PROVENTIL) (2.5 MG/3ML) 0.083% nebulizer solution Take 2.5 mg by nebulization 4 (four) times daily. And can use an additional twice a day if needed      . ALPRAZolam (XANAX) 0.5 MG tablet Take 1 tablet by mouth at bedtime and 1/2 tab twice daily prn for nerves  60 tablet  1  . budesonide (PULMICORT) 0.5 MG/2ML nebulizer solution Take 2 mLs (0.5 mg total) by nebulization 2 (two) times daily.  120 mL  11  . calcium-vitamin D (OSCAL) 250-125 MG-UNIT per tablet Take 1 tablet by mouth 2 (two) times daily.        . digoxin (LANOXIN) 0.125 MG tablet Take 1 tablet (125 mcg total) by mouth daily.  30 tablet  11  . fish oil-omega-3 fatty acids 1000 MG capsule Take 2 g by mouth daily.        .  furosemide (LASIX) 40 MG tablet Take 20 mg by mouth daily.        Marland Kitchen levofloxacin (LEVAQUIN) 500 MG tablet Take 1 tablet (500 mg total) by mouth daily.  17 tablet  0  . levofloxacin (LEVAQUIN) 500 MG tablet Take 1 tablet (500 mg total) by mouth daily.  7 tablet  0  . metoprolol succinate (TOPROL-XL) 25 MG 24 hr tablet Take 1 tablet (25 mg total) by mouth daily.  30 tablet  11  . Multiple Vitamin (MULTIVITAMIN) capsule Take 1 capsule by mouth daily.        . nitroGLYCERIN (NITROSTAT) 0.4 MG SL tablet Place 1 tablet (0.4 mg total) under the tongue every 5 (five) minutes as needed.  25 tablet  2  . potassium chloride (KLOR-CON) 10 MEQ CR tablet Take 1 tablet (10 mEq total) by mouth daily.  30 tablet  11  . ranitidine (ZANTAC) 150 MG tablet Take 150 mg by mouth 2 (two) times daily.      . vitamin B-12 (CYANOCOBALAMIN) 1000 MCG tablet Take 1,000 mcg by mouth daily.        Marland Kitchen warfarin (COUMADIN) 5 MG tablet Take 1 tablet (5 mg total) by mouth daily.  30 tablet  5    Allergies  Allergen Reactions  . Cefuroxime     Abdominal discomfort and vomiting  . Clindamycin     REACTION: rash  .  Codeine Phosphate     REACTION: unspecified  . Tramadol Hcl     REACTION: visual hallucinations  . Prednisone     Not allergic Can take but prefers not to--causes emotional changes    Past Medical History  Diagnosis Date  . Anxiety   . Hypertension   . Osteoporosis   . COPD (chronic obstructive pulmonary disease)   . CHF (congestive heart failure)     diastolic  . Atrial fibrillation   . History of colon polyps   . Peripheral vascular disease   . Renal insufficiency   . Constipation   . Seizure   . PERIPHERAL VASCULAR DISEASE 09/26/2006  . GERD (gastroesophageal reflux disease)     Past Surgical History  Procedure Date  . Abdominal hysterectomy   . Appendectomy   . Lumbar disc surgery     repair  . Retinal detachment surgery   . Cataract extraction     Family History  Problem Relation Age of  Onset  . Cancer Mother   . Cancer Father   . Cancer Sister     breast cancer  . Diabetes Son   . Cancer Sister     lung cancer    History   Social History  . Marital Status: Widowed    Spouse Name: N/A    Number of Children: 4  . Years of Education: N/A   Occupational History  . retired     Educational psychologist industries   Social History Main Topics  . Smoking status: Former Smoker    Types: Cigarettes  . Smokeless tobacco: Never Used  . Alcohol Use: Not on file  . Drug Use: Not on file  . Sexually Active: Not on file   Other Topics Concern  . Not on file   Social History Narrative   DNR done 10/06/11   Review of Systems Appetite isn't good Weight is stable    Objective:   Physical Exam  Constitutional: She appears well-developed. No distress.  Neck: Normal range of motion. Neck supple. No thyromegaly present.  Cardiovascular: Exam reveals no gallop.   Murmur heard.      Bradycardic Soft systolic murmur Irregular rhythm  Pulmonary/Chest: Effort normal. No respiratory distress. She has no wheezes. She has no rales.       Decreased breath sounds Slight rhonchi  Abdominal: Soft. There is no tenderness.  Musculoskeletal: She exhibits no edema and no tenderness.  Lymphadenopathy:    She has no cervical adenopathy.  Skin:       Small ulcer on top of right 4th toe---has good eschar on it Lateral malleolar ulcer on left ankle-- good eschar on it  Psychiatric: She has a normal mood and affect. Her behavior is normal.          Assessment & Plan:

## 2011-11-29 NOTE — Assessment & Plan Note (Signed)
Seems to be controlled with xanax at night and prn

## 2011-11-29 NOTE — Addendum Note (Signed)
Addended by: Tillman Abide I on: 11/29/2011 03:14 PM   Modules accepted: Orders

## 2011-11-29 NOTE — Assessment & Plan Note (Signed)
Vague and seems better now She is bradycardic---?decreased cerebral perfusion at times?  Will have hospice check labs with next protime--- met c, CBC with diff, free T4, ESR No changes in meds other than digoxin

## 2011-11-29 NOTE — Assessment & Plan Note (Signed)
Bradycardic now Could result in confusion if decreased perfusion Will stop the digoxin and consider stopping the metoprolol as well

## 2011-11-29 NOTE — Telephone Encounter (Signed)
I will be going out today for a home visit

## 2011-11-29 NOTE — Assessment & Plan Note (Signed)
Doesn't seem to be exacerbated Will finish out 5 day antibiotic now in case it helped in her improvement

## 2011-12-01 ENCOUNTER — Other Ambulatory Visit: Payer: Self-pay | Admitting: Internal Medicine

## 2011-12-01 ENCOUNTER — Encounter: Payer: Self-pay | Admitting: Internal Medicine

## 2011-12-01 LAB — CBC WITH DIFFERENTIAL/PLATELET
Basophil %: 0.7 %
HGB: 12.2 g/dL (ref 12.0–16.0)
Lymphocyte %: 28.1 %
MCHC: 32.8 g/dL (ref 32.0–36.0)
Monocyte #: 0.8 x10 3/mm (ref 0.2–0.9)
Monocyte %: 14.4 %
Neutrophil %: 52.8 %
RDW: 15.7 % — ABNORMAL HIGH (ref 11.5–14.5)
WBC: 5.4 10*3/uL (ref 3.6–11.0)

## 2011-12-01 LAB — COMPREHENSIVE METABOLIC PANEL
Anion Gap: 7 (ref 7–16)
BUN: 19 mg/dL — ABNORMAL HIGH (ref 7–18)
Calcium, Total: 9.4 mg/dL (ref 8.5–10.1)
Chloride: 101 mmol/L (ref 98–107)
Creatinine: 1.1 mg/dL (ref 0.60–1.30)
EGFR (African American): 52 — ABNORMAL LOW
EGFR (Non-African Amer.): 44 — ABNORMAL LOW
Potassium: 3.8 mmol/L (ref 3.5–5.1)
SGOT(AST): 24 U/L (ref 15–37)
SGPT (ALT): 22 U/L (ref 12–78)
Total Protein: 7 g/dL (ref 6.4–8.2)

## 2011-12-01 LAB — PROTIME-INR: Prothrombin Time: 20.9 secs — ABNORMAL HIGH (ref 11.5–14.7)

## 2011-12-01 LAB — SEDIMENTATION RATE: Erythrocyte Sed Rate: 29 mm/hr (ref 0–30)

## 2011-12-03 IMAGING — CR DG CHEST 2V
1 series · 2 of 2 positions shown · non-contrast
Comparison: none

REASON FOR EXAM: pneumonia
COMMENTS:

[Series 1: view not recorded · 0.17mm/px · 2 of 2 slices shown]
[im 1/2]
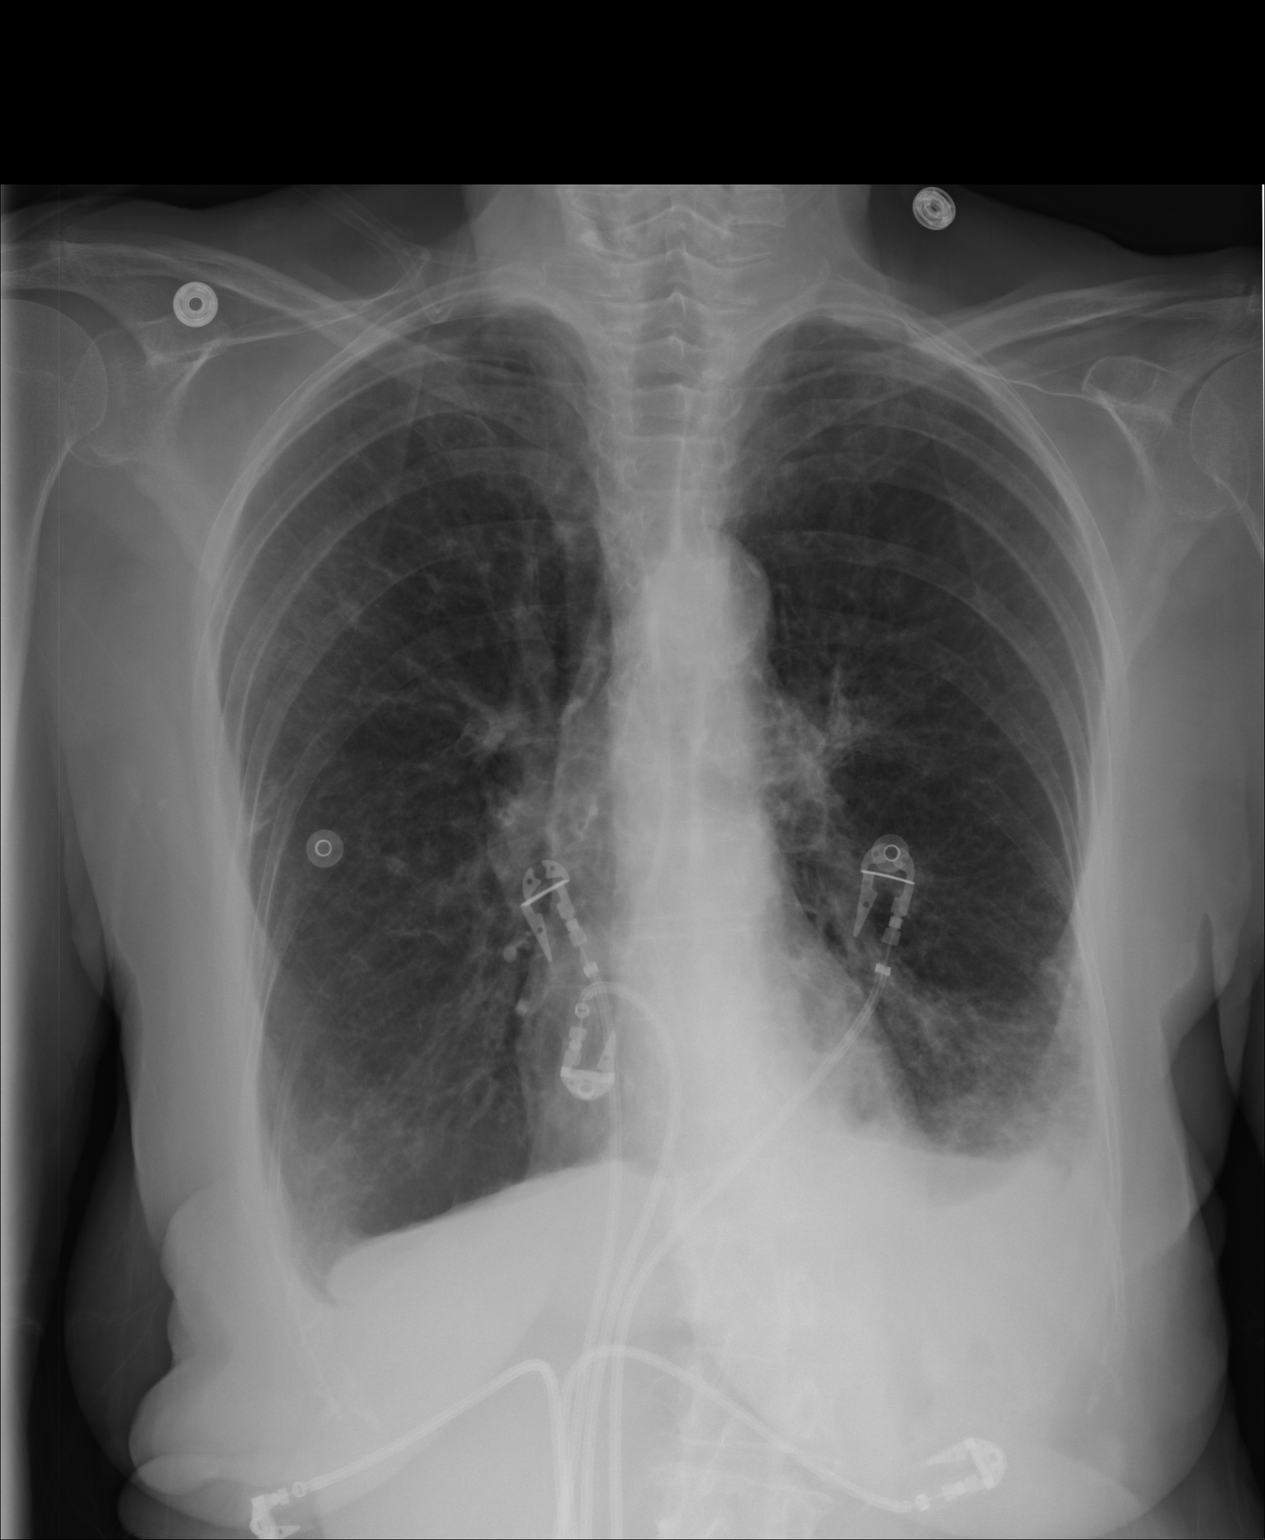
[im 2/2]
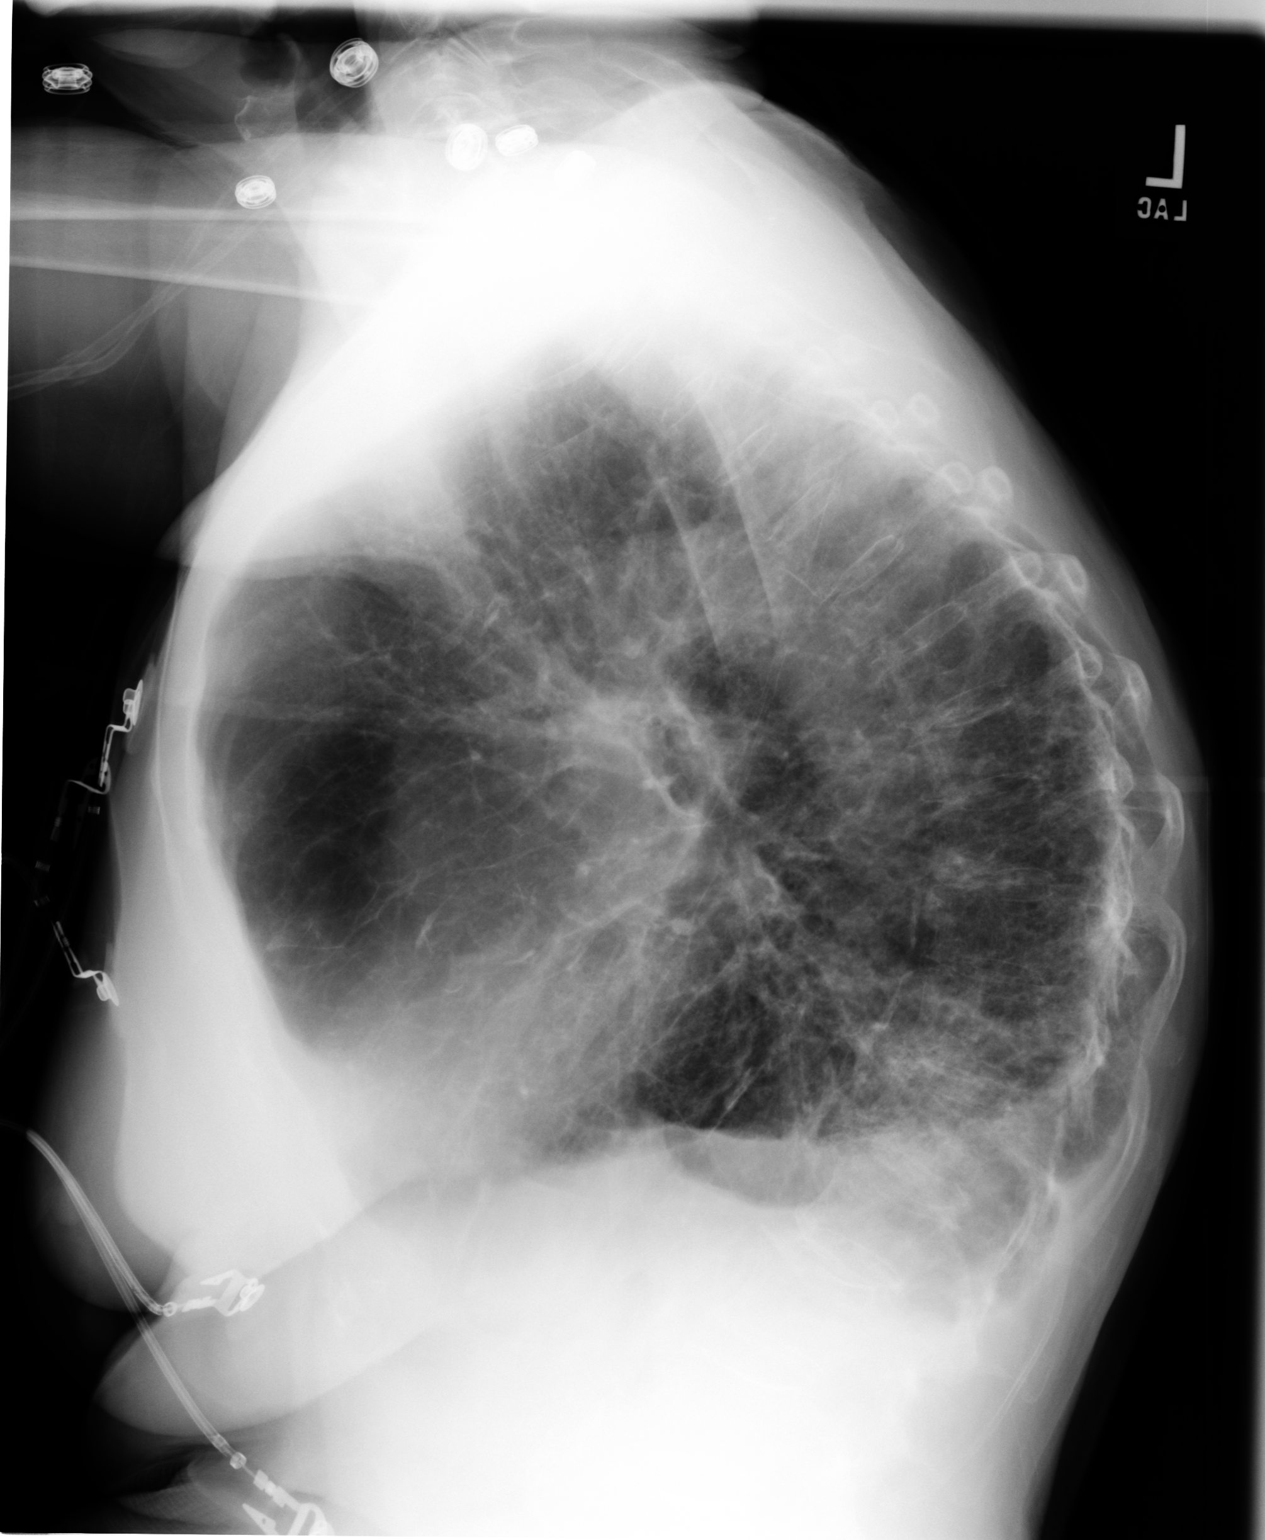

[2 of 2 positions shown; findings below may reference images not displayed]

PROCEDURE:     DXR - DXR CHEST PA (OR AP) AND LATERAL  - October 25, 2010  [DATE]

RESULT:     Comparison is made to the prior exam of 10/24/2010. The chest is
bilaterally hyperinflated compatible with COPD. The current exam shows a
patchy infiltrate in the left base compatible with pneumonia. The right lung
field is clear. Heart size is normal. The osseous structures show
generalized demineralization.
IMPRESSION: 1. COPD.
2. There has developed a patchy infiltrate at the left base compatible with
pneumonia.
3. Heart size is normal.

## 2011-12-15 ENCOUNTER — Encounter: Payer: Self-pay | Admitting: Internal Medicine

## 2011-12-19 ENCOUNTER — Other Ambulatory Visit: Payer: Self-pay | Admitting: *Deleted

## 2011-12-19 MED ORDER — FUROSEMIDE 40 MG PO TABS: 40.0000 mg | ORAL_TABLET | Freq: Every day | ORAL | Status: DC

## 2011-12-21 ENCOUNTER — Telehealth: Payer: Self-pay

## 2011-12-21 MED ORDER — AMOXICILLIN-POT CLAVULANATE 875-125 MG PO TABS: 1.0000 | ORAL_TABLET | Freq: Two times a day (BID) | ORAL | Status: DC

## 2011-12-21 NOTE — Telephone Encounter (Signed)
[name] [name] with [facility] pt has increased productive cough with yellow-green phlegm and congestion,more SOB and some disorientation for 2 days. [name] [name] request antibiotic and call back.

## 2011-12-21 NOTE — Telephone Encounter (Signed)
She has recently been on levaquin for a couple of courses so I would like her to use augmentin [id number] bid #[phone] Have her increase her nebs Prednisone doesn't agree with her but if she is having more trouble, we should try a short course of it  Call these orders to the hospice nurse

## 2011-12-21 NOTE — Telephone Encounter (Signed)
[name] [name] with [facility] faxed PT/INR report on Fri 12/15/11;[name] [name] wants to know if any change in Coumadin dosage and when want next PT drawn. Pt presently taking Coumadin 2.5 mg on Mon, Wed and Fri and Coumadin 5 mg other days.Please advise.

## 2011-12-21 NOTE — Telephone Encounter (Signed)
Spoke with hospice nurse and advised results rx sent to pharmacy by e-script  

## 2011-12-21 NOTE — Telephone Encounter (Signed)
Left message for [facility] hospice nurse and advised to call if any questions or problems  PER RESULT NOTE FROM DR. LETVAK Confirm current dose of 2.5mg  M/W/F and 5mg  other days If no missed doses, increase to 5mg  daily except 2.5 on just Mon and Thurs Recheck protime about 2 weeks

## 2011-12-22 ENCOUNTER — Inpatient Hospital Stay: Payer: Self-pay | Admitting: Internal Medicine

## 2011-12-22 LAB — BASIC METABOLIC PANEL
Anion Gap: 6 — ABNORMAL LOW (ref 7–16)
BUN: 26 mg/dL — ABNORMAL HIGH (ref 7–18)
Creatinine: 1.06 mg/dL (ref 0.60–1.30)
EGFR (African American): 54 — ABNORMAL LOW
EGFR (Non-African Amer.): 47 — ABNORMAL LOW
Glucose: 185 mg/dL — ABNORMAL HIGH (ref 65–99)
Osmolality: 285 (ref 275–301)

## 2011-12-22 LAB — PROTIME-INR
INR: 1.6
Prothrombin Time: 19.5 secs — ABNORMAL HIGH (ref 11.5–14.7)

## 2011-12-22 LAB — CBC
HGB: 11.7 g/dL — ABNORMAL LOW (ref 12.0–16.0)
MCH: 27.7 pg (ref 26.0–34.0)
Platelet: 154 10*3/uL (ref 150–440)
RBC: 4.2 10*6/uL (ref 3.80–5.20)
RDW: 15.7 % — ABNORMAL HIGH (ref 11.5–14.5)
WBC: 10.7 10*3/uL (ref 3.6–11.0)

## 2011-12-22 LAB — TROPONIN I: Troponin-I: <0.02 ng/mL

## 2011-12-22 LAB — CK TOTAL AND CKMB (NOT AT ARMC): CK-MB: 1.4 ng/mL (ref 0.5–3.6)

## 2011-12-23 LAB — PROTIME-INR
INR: 1.7
Prothrombin Time: 20.5 secs — ABNORMAL HIGH (ref 11.5–14.7)

## 2011-12-26 ENCOUNTER — Encounter: Payer: Self-pay | Admitting: Internal Medicine

## 2012-01-04 ENCOUNTER — Telehealth: Payer: Self-pay

## 2012-01-04 NOTE — Telephone Encounter (Signed)
Crystal with [facility] called PT/INR report; PT 18.7 and INR 1.9.  Pt presently taking Coumadin 5 mg daily except on Mon and Thur pt takes 2.5 mg. Crystal request call back with medication instructions or changes and when next PT/INR should be drawn.Please advise.

## 2012-01-04 NOTE — Telephone Encounter (Signed)
Please let her know No change in coumadin Recheck protime about 1 month  Let patient know it is fine and dose is not changing

## 2012-01-05 NOTE — Telephone Encounter (Signed)
Spoke with hospice nurse Crystal and advised results, she will let the pt and daughter know.

## 2012-01-15 DIAGNOSIS — I5032 Chronic diastolic (congestive) heart failure: Secondary | ICD-10-CM

## 2012-01-15 DIAGNOSIS — J449 Chronic obstructive pulmonary disease, unspecified: Secondary | ICD-10-CM

## 2012-01-25 ENCOUNTER — Telehealth: Payer: Self-pay

## 2012-01-25 NOTE — Telephone Encounter (Signed)
Yes, it looks like her last one was almost a month ago so they should go ahead and check next week

## 2012-01-25 NOTE — Telephone Encounter (Signed)
[name] [name] with [facility] left v/m requesting getting PT/INR next week by capillary monitoring.Please advise.

## 2012-01-26 NOTE — Telephone Encounter (Signed)
Spoke with hospice nurse and advised results  

## 2012-02-01 ENCOUNTER — Telehealth: Payer: Self-pay

## 2012-02-01 ENCOUNTER — Ambulatory Visit (INDEPENDENT_AMBULATORY_CARE_PROVIDER_SITE_OTHER): Payer: Medicare Other | Admitting: General Practice

## 2012-02-01 NOTE — Telephone Encounter (Signed)
Vicky with Hospice called PT/INR.  PT 18 and INR 1.8; pt presently taking Coumadin 5 mg daily except on Mon and Thur pt taking 2.5 mg. Call Vicky back with any dose changes and when to draw next PT/INR.

## 2012-02-01 NOTE — Patient Instructions (Signed)
Take extra 1/2 tablet today and then continue to take 1 tablet every day except 1/2 tablet on Monday and Thursday re-check on Friday 12/27.  Orders given to [place], RN @ Hospice.

## 2012-02-09 ENCOUNTER — Encounter: Payer: Self-pay | Admitting: Internal Medicine

## 2012-02-13 ENCOUNTER — Ambulatory Visit (INDEPENDENT_AMBULATORY_CARE_PROVIDER_SITE_OTHER): Payer: Medicare Other | Admitting: General Practice

## 2012-02-13 NOTE — Patient Instructions (Signed)
Change dosage to 1 tablet all days except 1/2 tablet on Wednesday.  Re-check on Thursday 1/2.  Orders given to [place], RN @ Hospice.

## 2012-02-15 LAB — POCT INR: INR: 2.7

## 2012-02-16 ENCOUNTER — Ambulatory Visit (INDEPENDENT_AMBULATORY_CARE_PROVIDER_SITE_OTHER): Payer: Medicare Other | Admitting: General Practice

## 2012-02-16 NOTE — Patient Instructions (Signed)
Continue to take 1 tablet all days except 1/2 tablet on Wednesday and Thursday.  Re-check on Thursday 1/10.  Orders given to [employer], RN @ Hospice.

## 2012-02-20 ENCOUNTER — Telehealth: Payer: Self-pay

## 2012-02-20 MED ORDER — MORPHINE SULFATE (CONCENTRATE) 20 MG/ML PO SOLN: 5.0000 mg | ORAL | Status: DC | PRN

## 2012-02-20 NOTE — Telephone Encounter (Signed)
rx faxed to pharmacy manually  

## 2012-02-20 NOTE — Telephone Encounter (Signed)
[name] [name] with [facility] left v/m requesting faxed rx for Roxanol 20 mg/ml; pt takes 0.25 ml - 1 ml every 2 hours for shortness of breath.(not on med list). Put Hospice patient on prescription and fax to [facility] pharmacy.Please advise.

## 2012-02-20 NOTE — Telephone Encounter (Signed)
Rx done. Please fax

## 2012-02-21 ENCOUNTER — Encounter: Payer: Self-pay | Admitting: Internal Medicine

## 2012-02-21 ENCOUNTER — Ambulatory Visit: Payer: Medicare Other | Admitting: Internal Medicine

## 2012-02-21 VITALS — BP 128/84 | HR 62 | Resp 18 | Wt 117.2 lb

## 2012-02-21 DIAGNOSIS — I10 Essential (primary) hypertension: Secondary | ICD-10-CM

## 2012-02-21 DIAGNOSIS — I5032 Chronic diastolic (congestive) heart failure: Secondary | ICD-10-CM

## 2012-02-21 DIAGNOSIS — R410 Disorientation, unspecified: Secondary | ICD-10-CM

## 2012-02-21 DIAGNOSIS — K219 Gastro-esophageal reflux disease without esophagitis: Secondary | ICD-10-CM

## 2012-02-21 DIAGNOSIS — F29 Unspecified psychosis not due to a substance or known physiological condition: Secondary | ICD-10-CM

## 2012-02-21 DIAGNOSIS — J449 Chronic obstructive pulmonary disease, unspecified: Secondary | ICD-10-CM

## 2012-02-21 DIAGNOSIS — I4891 Unspecified atrial fibrillation: Secondary | ICD-10-CM

## 2012-02-21 NOTE — Assessment & Plan Note (Signed)
BP Readings from Last 3 Encounters:  02/21/12 128/84  11/29/11 140/66  10/06/11 118/70   Good control No changes needed

## 2012-02-21 NOTE — Assessment & Plan Note (Signed)
Seems to be controlled on the ranitidine

## 2012-02-21 NOTE — Assessment & Plan Note (Signed)
I believe this is mostly chronotropic  Doing okay with rate control No edema on low dose furosemide  Will ask hospice to add met b and CBC with diff to next blood work

## 2012-02-21 NOTE — Assessment & Plan Note (Signed)
Severe but stable Requires extra albuterol and roxanol at times for worsened dyspnea Continues on hospice care

## 2012-02-21 NOTE — Progress Notes (Signed)
Subjective:    Patient ID: [name] [name], female    DOB: 19-Feb-1920, 79 y.o.   MRN: [id number]  HPI Daughter is here  She is doing fine Has been sleeping better with the temazepam Uses the xanax prn in day only now  Still gets dyspnea easily Can usually walk from room to room but if she does any more than that (like standing for a while in front of mirror for personal care) Cough is less frequent No recent fever Has the roxanol for prn use  Still gets occ periods of confusion Doesn't seem to be related to any meds (like xanax) No known seizures Did have spell of more severe weakness about 2 weeks ago---lasted 4 days then she was able to get back to baseline function  Occ gets left flank/rib pain Usually just when sitting Hasn't needed nitroglycerin No dizziness or orthostatic symptoms No palpitations  Had some edema she noticed with compression by socks Generally no problems with swelling  Current Outpatient Prescriptions on File Prior to Visit  Medication Sig Dispense Refill  . albuterol (PROVENTIL) (2.5 MG/3ML) 0.083% nebulizer solution Take 2.5 mg by nebulization 4 (four) times daily. And can use an additional twice a day if needed      . budesonide (PULMICORT) 0.5 MG/2ML nebulizer solution Take 2 mLs (0.5 mg total) by nebulization 2 (two) times daily.  120 mL  11  . fish oil-omega-3 fatty acids 1000 MG capsule Take 2 g by mouth daily.        . furosemide (LASIX) 40 MG tablet Take 20 mg by mouth daily.      . metoprolol succinate (TOPROL-XL) 25 MG 24 hr tablet Take 1 tablet (25 mg total) by mouth daily.  30 tablet  11  . morphine (ROXANOL) 20 MG/ML concentrated solution Take 0.25-1 mLs (5-20 mg total) by mouth every 2 (two) hours as needed for pain.  30 mL  0  . Multiple Vitamin (MULTIVITAMIN) capsule Take 1 capsule by mouth daily.        . nitroGLYCERIN (NITROSTAT) 0.4 MG SL tablet Place 1 tablet (0.4 mg total) under the tongue every 5 (five) minutes as needed.  25 tablet   2  . potassium chloride (KLOR-CON) 10 MEQ CR tablet Take 1 tablet (10 mEq total) by mouth daily.  30 tablet  11  . ranitidine (ZANTAC) 150 MG tablet Take 150 mg by mouth 2 (two) times daily.      . temazepam (RESTORIL) 15 MG capsule Take 15 mg by mouth at bedtime as needed.      . warfarin (COUMADIN) 5 MG tablet Take 1 tablet (5 mg total) by mouth daily.  30 tablet  5    Allergies  Allergen Reactions  . Cefuroxime     Abdominal discomfort and vomiting  . Clindamycin     REACTION: rash  . Codeine Phosphate     REACTION: unspecified  . Tramadol Hcl     REACTION: visual hallucinations  . Prednisone     Not allergic Can take but prefers not to--causes emotional changes    Past Medical History  Diagnosis Date  . Anxiety   . Hypertension   . Osteoporosis   . COPD (chronic obstructive pulmonary disease)   . CHF (congestive heart failure)     diastolic  . Atrial fibrillation   . History of colon polyps   . Peripheral vascular disease   . Renal insufficiency   . Constipation   . Seizure   . PERIPHERAL  VASCULAR DISEASE 09/26/2006  . GERD (gastroesophageal reflux disease)     Past Surgical History  Procedure Date  . Abdominal hysterectomy   . Appendectomy   . Lumbar disc surgery     repair  . Retinal detachment surgery   . Cataract extraction     Family History  Problem Relation Age of Onset  . Cancer Mother   . Cancer Father   . Cancer Sister     breast cancer  . Diabetes Son   . Cancer Sister     lung cancer    History   Social History  . Marital Status: Widowed    Spouse Name: N/A    Number of Children: 4  . Years of Education: N/A   Occupational History  . retired     Educational psychologist industries   Social History Main Topics  . Smoking status: Former Smoker    Types: Cigarettes  . Smokeless tobacco: Never Used  . Alcohol Use: Not on file  . Drug Use: Not on file  . Sexually Active: Not on file   Other Topics Concern  . Not on file   Social History  Narrative   DNR done 10/06/11   Review of Systems Appetite has never been great---no sig change Weighs herself daily Sleeping pretty well Bowels have been fine Has had some transient numbness in her feet     Objective:   Physical Exam  Constitutional: She appears well-developed. No distress.  Neck: Normal range of motion. Neck supple. No thyromegaly present.  Cardiovascular: Normal rate, normal heart sounds and intact distal pulses.  Exam reveals no gallop.   No murmur heard.      irregular  Pulmonary/Chest: Effort normal. No respiratory distress. She has no wheezes. She has no rales.       Significantly decreased breath sounds but otherwise clear  Abdominal: Soft. There is no tenderness.  Musculoskeletal: She exhibits no edema.  Lymphadenopathy:    She has no cervical adenopathy.  Skin: No rash noted. No erythema.       Mycotic toenails          Assessment & Plan:

## 2012-02-21 NOTE — Assessment & Plan Note (Signed)
Has episodic spells Not clear why---?hypoxia Had several day weak spell recently---not sure of etiology of that either

## 2012-02-21 NOTE — Assessment & Plan Note (Signed)
Rate is controlled Continues on coumadin

## 2012-02-22 ENCOUNTER — Ambulatory Visit (INDEPENDENT_AMBULATORY_CARE_PROVIDER_SITE_OTHER): Payer: Medicare Other | Admitting: General Practice

## 2012-02-22 LAB — POCT INR: INR: 2.1

## 2012-02-22 NOTE — Patient Instructions (Addendum)
Continue to take 1 tablet all days except 1/2 tablet on Wednesday and Thursday.  Re-check on Friday 1/17. Orders given to [place], RN @ Hospice.

## 2012-02-22 NOTE — Telephone Encounter (Signed)
Spoke with hospice nurse and advised results that [facility] rx on 02/20/12 spoke with Homero Fellers

## 2012-02-22 NOTE — Telephone Encounter (Signed)
[name] [name] with [facility] left v/m family afraid will run out of roxinol and request roxinol rx faxed to [facility] today. [name] [name] request call back to let her know done.

## 2012-02-28 ENCOUNTER — Other Ambulatory Visit: Payer: Self-pay | Admitting: *Deleted

## 2012-02-28 NOTE — Telephone Encounter (Signed)
Received a fax from pharmacy that per hospice nurse this med was increased to 4 time daily. They need a new rx with new instructions if this is correct. Is it ok to change?

## 2012-02-28 NOTE — Telephone Encounter (Signed)
I am pretty sure we stopped this She is still on albuterol nebs though

## 2012-02-28 NOTE — Telephone Encounter (Signed)
Spoke with Chip Boer hospice nurse and this Pulmicort is for the nebulizer not an inhaler, pt takes this twice daily and the albuterol nebulizer 4 times daily. Please advise

## 2012-02-29 LAB — PROTIME-INR: INR: 1.8 — AB (ref 0.9–1.1)

## 2012-02-29 MED ORDER — BUDESONIDE 0.5 MG/2ML IN SUSP: 0.5000 mg | Freq: Two times a day (BID) | RESPIRATORY_TRACT | Status: AC

## 2012-02-29 NOTE — Telephone Encounter (Signed)
Spoke with hospice nurse and advised results  

## 2012-02-29 NOTE — Telephone Encounter (Signed)
Actually, I believe I stopped the budesonide on a different hospice patient Okay to refill budesonide for bid use For 1 year

## 2012-02-29 NOTE — Telephone Encounter (Signed)
The budesonide definitely is not more than twice a day

## 2012-03-01 ENCOUNTER — Ambulatory Visit (INDEPENDENT_AMBULATORY_CARE_PROVIDER_SITE_OTHER): Payer: Medicare Other | Admitting: General Practice

## 2012-03-01 LAB — PROTIME-INR: INR: 1.8 — AB (ref <=1.1)

## 2012-03-01 NOTE — Patient Instructions (Signed)
Take 7.5 mg today and then take 5 mg all days except take 2.5 mg on Monday and Thursday.  Re-check next Thursday. Orders given to [place], RN @ Hospice.

## 2012-03-07 ENCOUNTER — Ambulatory Visit (INDEPENDENT_AMBULATORY_CARE_PROVIDER_SITE_OTHER): Payer: Medicare Other | Admitting: General Practice

## 2012-03-07 LAB — POCT INR: INR: 2.2

## 2012-03-07 NOTE — Patient Instructions (Signed)
Continue to take 7.5 mg today and then take 5 mg all days except take 2.5 mg on Monday and Thursday.  Re-check in 2 weeks.. Orders given to [place], RN @ Hospice.

## 2012-03-13 ENCOUNTER — Telehealth: Payer: Self-pay

## 2012-03-13 NOTE — Telephone Encounter (Signed)
[name] [name] with [facility] left v/m requesting to change pulmicort to another type of nebulizer treatment due to expense; pt using pulmicort twice a day and albuterol in nebulizer 4 x a day. Pt also request a hand held inhaler when she is out of home(for example when at [employer]) Pt gets nervous if does not have something at all times..Please advise.

## 2012-03-14 NOTE — Telephone Encounter (Signed)
I am not aware of another nebulized steroid  She can check with pharmacist and change if another is less expensive  I thought she had an albuterol inhaler already (though she has trouble pulling air in for it to work) She can use this up to 4x/day but must use with a spacer

## 2012-03-15 ENCOUNTER — Telehealth: Payer: Self-pay | Admitting: *Deleted

## 2012-03-15 ENCOUNTER — Encounter: Payer: Self-pay | Admitting: Internal Medicine

## 2012-03-15 DIAGNOSIS — Z0279 Encounter for issue of other medical certificate: Secondary | ICD-10-CM

## 2012-03-15 NOTE — Telephone Encounter (Signed)
[name] [name] checked with pharmacist and the only other alternative to the pulmicort would be a oral steroid? [name] [name] wanted to know what Dr.Letvak thought about that? Per [name] [name] pt took a tapered dose a while back and did well. please advise

## 2012-03-15 NOTE — Telephone Encounter (Signed)
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 [employer] will not let patient out of her contract unless they have a letter from you on letterhead stating that she in incapable of operating the system (which she is). If this is ok daughter request to be notified and she will pick up the letter.

## 2012-03-15 NOTE — Telephone Encounter (Signed)
I spoke with patient's daughter, [name].  [name] will pick up the letter.  I notified her of charge.

## 2012-03-15 NOTE — Telephone Encounter (Signed)
Letter done $20 charge 

## 2012-03-15 NOTE — Telephone Encounter (Signed)
Left detailed message for hospice nurse on her VM, advised to call if anything else needs to be done.

## 2012-03-16 NOTE — Telephone Encounter (Signed)
That is not reasonable The side effects of oral steroids are quite onerous If the pulmicort is not affordable, we will have to try without any steroid therapy

## 2012-03-18 NOTE — Telephone Encounter (Signed)
Spoke with hospice nurse and advised results, she will try to keep the pulmicort

## 2012-03-19 ENCOUNTER — Ambulatory Visit (INDEPENDENT_AMBULATORY_CARE_PROVIDER_SITE_OTHER): Payer: Medicare Other | Admitting: General Practice

## 2012-03-19 NOTE — Patient Instructions (Signed)
Take 7.5 mg today and then take 5 mg all days except take 2.5 mg on Thursday.  Re-check in 2 weeks. Orders given to [place], RN @ Hospice.

## 2012-04-02 ENCOUNTER — Ambulatory Visit (INDEPENDENT_AMBULATORY_CARE_PROVIDER_SITE_OTHER): Payer: Medicare Other | Admitting: General Practice

## 2012-04-02 NOTE — Patient Instructions (Signed)
Continue to take 5 mg all days except take 2.5 mg on Thursday.  Re-check in 3 weeks. Orders given to [place], RN @ Hospice.

## 2012-04-11 DIAGNOSIS — J449 Chronic obstructive pulmonary disease, unspecified: Secondary | ICD-10-CM

## 2012-04-11 DIAGNOSIS — I5032 Chronic diastolic (congestive) heart failure: Secondary | ICD-10-CM

## 2012-04-22 ENCOUNTER — Telehealth: Payer: Self-pay

## 2012-04-22 NOTE — Telephone Encounter (Signed)
Please give verbal order for U/A & C/S.  Dx dysuria.  Thanks.

## 2012-04-22 NOTE — Telephone Encounter (Signed)
Vicky advised

## 2012-04-22 NOTE — Telephone Encounter (Signed)
Vicky with [facility] is to see pt on 04/23/12 and request order for U/A & C/S; pt has been experiencing low back pain for 1 week and now urinating more frequently with odor to urine.Please advise.

## 2012-04-23 ENCOUNTER — Ambulatory Visit (INDEPENDENT_AMBULATORY_CARE_PROVIDER_SITE_OTHER): Payer: Medicare Other | Admitting: General Practice

## 2012-04-23 NOTE — Patient Instructions (Addendum)
Skip coumadin today and then take 2.5 mg on Wed and Thurs.  Re-check on Friday.  Orders given to [place], RN @ Hospice.

## 2012-04-25 ENCOUNTER — Emergency Department (HOSPITAL_COMMUNITY): Payer: Medicare Other

## 2012-04-25 ENCOUNTER — Emergency Department (HOSPITAL_COMMUNITY)
Admission: EM | Admit: 2012-04-25 | Discharge: 2012-04-25 | Disposition: A | Discharge status: Home / Self Care | Payer: Medicare Other | Attending: Emergency Medicine | Admitting: Emergency Medicine

## 2012-04-25 ENCOUNTER — Telehealth: Payer: Self-pay | Admitting: Internal Medicine

## 2012-04-25 DIAGNOSIS — Z9849 Cataract extraction status, unspecified eye: Secondary | ICD-10-CM | POA: Insufficient documentation

## 2012-04-25 DIAGNOSIS — R5381 Other malaise: Secondary | ICD-10-CM | POA: Insufficient documentation

## 2012-04-25 DIAGNOSIS — J4489 Other specified chronic obstructive pulmonary disease: Secondary | ICD-10-CM | POA: Insufficient documentation

## 2012-04-25 DIAGNOSIS — K219 Gastro-esophageal reflux disease without esophagitis: Secondary | ICD-10-CM | POA: Insufficient documentation

## 2012-04-25 DIAGNOSIS — Z79899 Other long term (current) drug therapy: Secondary | ICD-10-CM | POA: Insufficient documentation

## 2012-04-25 DIAGNOSIS — M545 Low back pain, unspecified: Secondary | ICD-10-CM | POA: Insufficient documentation

## 2012-04-25 DIAGNOSIS — IMO0002 Reserved for concepts with insufficient information to code with codable children: Secondary | ICD-10-CM | POA: Insufficient documentation

## 2012-04-25 DIAGNOSIS — R0989 Other specified symptoms and signs involving the circulatory and respiratory systems: Secondary | ICD-10-CM | POA: Insufficient documentation

## 2012-04-25 DIAGNOSIS — Z8679 Personal history of other diseases of the circulatory system: Secondary | ICD-10-CM | POA: Insufficient documentation

## 2012-04-25 DIAGNOSIS — J449 Chronic obstructive pulmonary disease, unspecified: Secondary | ICD-10-CM | POA: Insufficient documentation

## 2012-04-25 DIAGNOSIS — G8929 Other chronic pain: Secondary | ICD-10-CM | POA: Insufficient documentation

## 2012-04-25 DIAGNOSIS — R319 Hematuria, unspecified: Secondary | ICD-10-CM | POA: Insufficient documentation

## 2012-04-25 DIAGNOSIS — Z87891 Personal history of nicotine dependence: Secondary | ICD-10-CM | POA: Insufficient documentation

## 2012-04-25 DIAGNOSIS — I10 Essential (primary) hypertension: Secondary | ICD-10-CM | POA: Insufficient documentation

## 2012-04-25 DIAGNOSIS — Z8719 Personal history of other diseases of the digestive system: Secondary | ICD-10-CM | POA: Insufficient documentation

## 2012-04-25 DIAGNOSIS — Z860100 Personal history of colon polyps, unspecified: Secondary | ICD-10-CM | POA: Insufficient documentation

## 2012-04-25 DIAGNOSIS — N289 Disorder of kidney and ureter, unspecified: Secondary | ICD-10-CM | POA: Insufficient documentation

## 2012-04-25 DIAGNOSIS — I739 Peripheral vascular disease, unspecified: Secondary | ICD-10-CM | POA: Insufficient documentation

## 2012-04-25 DIAGNOSIS — Z7901 Long term (current) use of anticoagulants: Secondary | ICD-10-CM | POA: Insufficient documentation

## 2012-04-25 DIAGNOSIS — M47816 Spondylosis without myelopathy or radiculopathy, lumbar region: Secondary | ICD-10-CM

## 2012-04-25 DIAGNOSIS — I509 Heart failure, unspecified: Secondary | ICD-10-CM | POA: Insufficient documentation

## 2012-04-25 DIAGNOSIS — F411 Generalized anxiety disorder: Secondary | ICD-10-CM | POA: Insufficient documentation

## 2012-04-25 LAB — URINALYSIS, ROUTINE W REFLEX MICROSCOPIC
Hgb urine dipstick: NEGATIVE
Ketones, ur: NEGATIVE mg/dL
Protein, ur: NEGATIVE mg/dL
Urobilinogen, UA: 0.2 mg/dL (ref 0.0–1.0)

## 2012-04-25 LAB — BASIC METABOLIC PANEL
Calcium: 9.6 mg/dL (ref 8.4–10.5)
GFR calc non Af Amer: 50 mL/min — ABNORMAL LOW (ref >90)
Glucose, Bld: 175 mg/dL — ABNORMAL HIGH (ref 70–99)
Sodium: 137 mEq/L (ref 135–145)

## 2012-04-25 LAB — CBC
MCH: 25.1 pg — ABNORMAL LOW (ref 26.0–34.0)
MCHC: 32.1 g/dL (ref 30.0–36.0)
MCV: 78.1 fL (ref 78.0–100.0)
Platelets: 225 10*3/uL (ref 150–400)
RDW: 16 % — ABNORMAL HIGH (ref 11.5–15.5)
WBC: 8.3 10*3/uL (ref 4.0–10.5)

## 2012-04-25 NOTE — Telephone Encounter (Signed)
Please call to check on pt. 

## 2012-04-25 NOTE — ED Notes (Signed)
Patient C/O abdominal and flank pain for 2 weeks.  She has been treated for a UTI recently.  C/O her urine smelling strong. She is a hospice patient and is a DNR

## 2012-04-25 NOTE — ED Provider Notes (Signed)
History     CSN: [id number]  Arrival date & time 04/25/12  1204   First MD Initiated Contact with Patient 04/25/12 1205      Chief Complaint  Patient presents with  . Flank Pain    (Consider location/radiation/quality/duration/timing/severity/associated sxs/prior treatment) Patient is a 79 y.o. female presenting with back pain. The history is provided by the patient. No language interpreter was used.  Back Pain Location:  Lumbar spine, sacro-iliac joint and gluteal region Quality:  Aching and stiffness Stiffness is present:  All day Radiates to:  Does not radiate Pain severity:  Moderate Onset quality:  Gradual Duration:  2 weeks Timing:  Constant Progression:  Worsening Chronicity:  New Context: not falling, not jumping from heights, not lifting heavy objects, not MVA, not occupational injury, not pedestrian accident, not physical stress, not recent illness, not recent injury and not twisting   Relieved by:  Narcotics Worsened by:  Bending and ambulation Associated symptoms: abdominal pain (chronic) and weakness   Associated symptoms: no abdominal swelling, no bladder incontinence, no bowel incontinence, no chest pain, no dysuria, no fever, no leg pain, no numbness and no paresthesias   Risk factors: hx of osteoporosis, lack of exercise, menopause, steroid use and vascular disease   Risk factors: no hx of cancer and no recent surgery     Past Medical History  Diagnosis Date  . Anxiety   . Hypertension   . Osteoporosis   . COPD (chronic obstructive pulmonary disease)   . CHF (congestive heart failure)     diastolic  . Atrial fibrillation   . History of colon polyps   . Peripheral vascular disease   . Renal insufficiency   . Constipation   . Seizure   . PERIPHERAL VASCULAR DISEASE 09/26/2006  . GERD (gastroesophageal reflux disease)     Past Surgical History  Procedure Laterality Date  . Abdominal hysterectomy    . Appendectomy    . Lumbar disc surgery     repair  . Retinal detachment surgery    . Cataract extraction      Family History  Problem Relation Age of Onset  . Cancer Mother   . Cancer Father   . Cancer Sister     breast cancer  . Diabetes Son   . Cancer Sister     lung cancer    History  Substance Use Topics  . Smoking status: Former Smoker    Types: Cigarettes  . Smokeless tobacco: Never Used  . Alcohol Use: Not on file    OB History   Grav Para Term Preterm Abortions TAB SAB Ect Mult Living                  Review of Systems  Constitutional: Positive for fatigue. Negative for fever, diaphoresis, activity change and appetite change.  HENT: Negative for nosebleeds, congestion, sore throat, rhinorrhea, trouble swallowing and neck pain.   Respiratory: Negative for cough and chest tightness.   Cardiovascular: Negative for chest pain, palpitations and leg swelling.  Gastrointestinal: Positive for abdominal pain (chronic). Negative for nausea, diarrhea, abdominal distention and bowel incontinence.  Endocrine: Negative for polyuria.  Genitourinary: Positive for hematuria. Negative for bladder incontinence, dysuria, urgency, frequency and enuresis.  Musculoskeletal: Positive for back pain, arthralgias and gait problem. Negative for myalgias and joint swelling.  Skin: Negative for color change, pallor, rash and wound.  Neurological: Positive for weakness. Negative for tremors, seizures, syncope, speech difficulty, light-headedness, numbness and paresthesias.  Psychiatric/Behavioral: Negative for dysphoric mood.  All other systems reviewed and are negative.    Allergies  Cefuroxime; Ace inhibitors; Clindamycin; Codeine phosphate; Tramadol hcl; Penicillins; and Prednisone  Home Medications   Current Outpatient Rx  Name  Route  Sig  Dispense  Refill  . albuterol (PROVENTIL HFA;VENTOLIN HFA) 108 (90 BASE) MCG/ACT inhaler   Inhalation   Inhale 2 puffs into the lungs every 6 (six) hours as needed for wheezing.           Marland Kitchen albuterol (PROVENTIL) (2.5 MG/3ML) 0.083% nebulizer solution   Nebulization   Take 2.5 mg by nebulization 4 (four) times daily. And can use an additional twice a day if needed         . ALPRAZolam (XANAX) 0.5 MG tablet   Oral   Take 0.25-0.5 mg by mouth 3 (three) times daily as needed. Take 1 tablet by mouth at bedtime and 1/2 tab twice daily prn for nerves         . budesonide (PULMICORT) 0.5 MG/2ML nebulizer solution   Nebulization   Take 2 mLs (0.5 mg total) by nebulization 2 (two) times daily.   120 mL   11     Requires this since she can no longer properly inh ...   . Calcium Carbonate-Vitamin D (CALCIUM 600+D) 600-400 MG-UNIT per tablet   Oral   Take 1 tablet by mouth daily.         . cholecalciferol (VITAMIN D) 1000 UNITS tablet   Oral   Take 1,000 Units by mouth daily.         . fish oil-omega-3 fatty acids 1000 MG capsule   Oral   Take 2 g by mouth daily.           . furosemide (LASIX) 40 MG tablet   Oral   Take 20 mg by mouth daily.         Marland Kitchen HYDROcodone-acetaminophen (NORCO/VICODIN) 5-325 MG per tablet   Oral   Take 1-2 tablets by mouth every 4 (four) hours as needed for pain.         Marland Kitchen levofloxacin (LEVAQUIN) 500 MG tablet   Oral   Take 500 mg by mouth daily. 3 day therapy completed on 04/25/11         . metoprolol succinate (TOPROL-XL) 25 MG 24 hr tablet   Oral   Take 1 tablet (25 mg total) by mouth daily.   30 tablet   11   . morphine (ROXANOL) 20 MG/ML concentrated solution   Oral   Take 0.25-1 mLs (5-20 mg total) by mouth every 2 (two) hours as needed for pain.   30 mL   0     HOSPICE PATIENT   . Multiple Vitamin (MULTIVITAMIN) capsule   Oral   Take 1 capsule by mouth daily.           . nitroGLYCERIN (NITROSTAT) 0.4 MG SL tablet   Sublingual   Place 1 tablet (0.4 mg total) under the tongue every 5 (five) minutes as needed.   25 tablet   2   . potassium chloride (KLOR-CON) 10 MEQ CR tablet   Oral   Take 1 tablet  (10 mEq total) by mouth daily.   30 tablet   11   . ranitidine (ZANTAC) 150 MG tablet   Oral   Take 150 mg by mouth 2 (two) times daily.         Marland Kitchen senna-docusate (SENOKOT-S) 8.6-50 MG per tablet   Oral   Take 1 tablet by mouth  2 (two) times daily.         . temazepam (RESTORIL) 15 MG capsule   Oral   Take 15 mg by mouth at bedtime as needed for sleep.          Marland Kitchen warfarin (COUMADIN) 5 MG tablet   Oral   Take 2.5 mg by mouth daily.           BP 123/65  Pulse 71  Temp(Src) 98.9 F (37.2 C) (Oral)  Resp 20  SpO2 97%  Physical Exam  Nursing note and vitals reviewed. Constitutional: She is oriented to person, place, and time. She appears well-developed and well-nourished. No distress.  HENT:  Head: Normocephalic and atraumatic.  Right Ear: External ear normal.  Nose: Nose normal.  Mouth/Throat: Oropharynx is clear and moist.  Eyes: Conjunctivae are normal. Pupils are equal, round, and reactive to light. Right eye exhibits no discharge. Left eye exhibits no discharge. No scleral icterus.  Neck: Normal range of motion. Neck supple. No JVD present. No tracheal deviation present.  Cardiovascular: Normal rate, regular rhythm, intact distal pulses and normal pulses.  PMI is not displaced.  Exam reveals distant heart sounds. Exam reveals no gallop and no friction rub.   No murmur heard. Pulmonary/Chest: Effort normal. No stridor. No respiratory distress. She has decreased breath sounds (diffesely diminished, especially at the bases). She has no wheezes. She has no rhonchi. She has no rales. She exhibits no tenderness.  Abdominal: Soft. Normal appearance and bowel sounds are normal. She exhibits no shifting dullness, no ascites, no pulsatile midline mass and no mass. There is no hepatosplenomegaly. There is no tenderness. There is no rigidity, no rebound, no guarding, no CVA tenderness, no tenderness at McBurney's point and negative Murphy's sign. No hernia.  Musculoskeletal:  Normal range of motion. She exhibits tenderness (lumbar). She exhibits no edema.       Right hip: Normal.       Left hip: Normal.       Cervical back: Normal.       Thoracic back: Normal.       Lumbar back: She exhibits tenderness and pain. She exhibits no bony tenderness, no swelling, no edema, no deformity and no spasm.       Legs: Lymphadenopathy:    She has no cervical adenopathy.  Neurological: She is alert and oriented to person, place, and time. No cranial nerve deficit. She exhibits normal muscle tone.  Skin: Skin is warm and dry. No rash noted. She is not diaphoretic. No erythema. No pallor.  Psychiatric: She has a normal mood and affect. Her behavior is normal.    ED Course  Procedures (including critical care time)  Labs Reviewed  BASIC METABOLIC PANEL  URINALYSIS, ROUTINE W REFLEX MICROSCOPIC  CBC   No results found.   No diagnosis found.    MDM  Pt presents for evaluation of lowe back discomfort and progressively worsening difficulty walking.  She appears nontoxic, note stable VS, NAD.  She is currently receiving comfort measures through a local hospice service secondary to advanced COPD and multiple other chronic conditions.  She completed a 3 day course of abx for a UTI but still reports foul smelling urine.  She has no lower extremity weakness or sensory deficits but does have some reproducible lumbar and upper gluteal tenderness.  Will obtain x-rays of the lumbar spine, basic labs, and a urinalysis.  Will reassess.  1540.  Pt stable, NAD.  U/A nl.  Labs unchanged/unremarkable.  Note  degenerative lumbar changes without acute fx.  Pt has not been taking the hydrocodone prescribed for pain regularly or as instructed.  Pt will take the hydrocodone q6 hrs  Over the next 24-48 hours and see if there is a change in her level of discomfort.  If she is able to tolerate the regular dosing and still has significant pain, she will increase the does to 1.5 tabs q6 hrs.  She has  been encouraged to follow-up closely with her primary physician and hospice provider.      Tobin Chad, MD 04/25/12 1544

## 2012-04-25 NOTE — Telephone Encounter (Signed)
Patient Information:  Caller Name: [name] [name]  Phone: [phone]  Patient: [name] [name]  Gender: Female  DOB: 08-Aug-1920  Age: 79 Years  PCP: [facility])  Office Follow Up:  Does the office need to follow up with this patient?: No  Instructions For The Office: N/A  RN Note:  On hospice; usual hospice nurse is out on surgical leave.  Had severe lower back pain  04/23/12 to 04/24/12 unrelieved with Hydrocodone. Had 2 doses of Morphine 04/24/12.  This morning 04/25/12, is able to walk; pain rated 2-3/10.  Last pain medication at 1900 04/24/12.  Typically, uses Hydrocodone TID and Morphine prn.  No known falls or injuries. Last BM 04/24/12. On Levaquin for suspected UTI before urine culture done.  Blood was noted in urinalysis. Also gross blood visible in urine either 3/11 or 04/24/12. Dr. Alphonsus Sias is out of office until 04/29/12.  No xray in office; advised to go to [facility] ED now or call hospice nurse to see if they have access to mobile imaging.  Symptoms  Reason For Call & Symptoms: On hospice; reports increased lower back pain.  Reviewed Health History In EMR: Yes  Reviewed Medications In EMR: Yes  Reviewed Allergies In EMR: Yes  Reviewed Surgeries / Procedures: Yes  Date of Onset of Symptoms: 04/11/2012  Treatments Tried: Hydrocodone  Treatments Tried Worked: No  Guideline(s) Used:  Back Pain  Disposition Per Guideline:   Go to ED Now (or to Office with PCP Approval)  Reason For Disposition Reached:   Blood in urine (red, pink, or tea-colored)  Advice Given:  Call Back If:  You become worse.  RN Overrode Recommendation:  Go To ED  Will either go to [facility] ED or contact Hospice nurse.

## 2012-04-25 NOTE — Progress Notes (Signed)
   CARE MANAGEMENT ED NOTE 04/25/2012  Patient:  [name] [name], [name] [name]   Account Number:  [id number]  Date Initiated:  04/25/2012  Documentation initiated by:  Fransico Michael  Subjective/Objective Assessment:   presented to ED with c/o flank pain     Subjective/Objective Assessment Detail:     Action/Plan:   active patient of [facility]   Action/Plan Detail:   Anticipated DC Date:  04/25/2012     Status Recommendation to Physician:   Result of Recommendation:     In-house referral  Clinical Social Worker   DC Planning Services  CM consult    Choice offered to / List presented to:            Status of service:  Completed, signed off  ED Comments:   ED Comments Detail:  04/25/12-1507-J.Minnich,RN,BSN [phone]     Received a call from Jason Nest nurse liason for [facility]. Reports that Dr. Alphonsus Sias (PCP) requested that patient be sent to ED for evaluation for increasing pain. Reports that patient is an active patient with [facility]. Requested that she be notified of disposition from ED stay. ED staff in [facility] given contact information.  Contact information:       Jason Nest   nurse liason with [facility]      cell phone- [phone]

## 2012-04-25 NOTE — Telephone Encounter (Signed)
Spoke with daughter and pt is still in the ED, they have done an x-ray and repeat urine. I had showed the urine results from 04/23/2012 to Dr.Aron which she stated was ok, some blood but we need to wait for the urine culture report. Daughter will call back and let us know what's going on with patient.  ( UA report on your desk)

## 2012-04-26 ENCOUNTER — Ambulatory Visit (INDEPENDENT_AMBULATORY_CARE_PROVIDER_SITE_OTHER): Admitting: General Practice

## 2012-04-26 NOTE — Patient Instructions (Signed)
Take 7.5 mg of coumadin today and tomorrow and then resume taking 5 mg all days except 2.5 mg on Thursday.  Re-check in 1 week.  Orders given to [employer], RN @ Hospice.

## 2012-04-27 NOTE — Telephone Encounter (Signed)
Please check on her Monday morning 

## 2012-05-02 ENCOUNTER — Telehealth: Payer: Self-pay | Admitting: General Practice

## 2012-05-02 NOTE — Telephone Encounter (Signed)
Incoming call from [place], RN @ Hospice saying that RN was unable to get reading for INR on coag machine and that RN will be attempting venipuncture.

## 2012-05-02 NOTE — Telephone Encounter (Signed)
Spoke with daughter and pt have some arthritis in her back, no fractures, blood work was normal, pt taking hydrocodone for pain as needed, per daughter does pt need to be seen? Per hospital they where supposed to follow-up in 1 week, please advise

## 2012-05-02 NOTE — Telephone Encounter (Signed)
I spoke to her daughter I will plan to see her next Wednesday around 2:30PM

## 2012-05-06 ENCOUNTER — Ambulatory Visit (INDEPENDENT_AMBULATORY_CARE_PROVIDER_SITE_OTHER): Admitting: General Practice

## 2012-05-06 NOTE — Patient Instructions (Signed)
Resume taking 5 mg all days except 2.5 mg on Thursday.  Re-check in 1 week.  Orders given to [employer], RN @ Hospice.

## 2012-05-08 ENCOUNTER — Encounter: Payer: Self-pay | Admitting: Internal Medicine

## 2012-05-08 ENCOUNTER — Ambulatory Visit: Payer: Medicare Other | Admitting: Internal Medicine

## 2012-05-08 VITALS — BP 114/76 | HR 60 | Resp 24 | Wt 116.8 lb

## 2012-05-08 DIAGNOSIS — I4891 Unspecified atrial fibrillation: Secondary | ICD-10-CM

## 2012-05-08 DIAGNOSIS — K219 Gastro-esophageal reflux disease without esophagitis: Secondary | ICD-10-CM

## 2012-05-08 DIAGNOSIS — I5032 Chronic diastolic (congestive) heart failure: Secondary | ICD-10-CM

## 2012-05-08 DIAGNOSIS — I10 Essential (primary) hypertension: Secondary | ICD-10-CM

## 2012-05-08 DIAGNOSIS — J449 Chronic obstructive pulmonary disease, unspecified: Secondary | ICD-10-CM

## 2012-05-08 DIAGNOSIS — M549 Dorsalgia, unspecified: Secondary | ICD-10-CM

## 2012-05-08 NOTE — Assessment & Plan Note (Signed)
BP Readings from Last 3 Encounters:  05/08/12 114/76  04/25/12 141/87  02/21/12 128/84   Good control No changes

## 2012-05-08 NOTE — Assessment & Plan Note (Signed)
Severe with mild decline Needs more help now with personal care Continue current meds---has nebs, roxanol and xanax for nerves/dyspnea

## 2012-05-08 NOTE — Progress Notes (Signed)
Subjective:    Patient ID: [name] [name], female    DOB: [birth year], 79 y.o.   MRN: [id number]  HPI Daughter and Vicky--hospice nurse here  Had worsening of her back pain Harder getting around Legs seemed weaker Tried heat and hydrocodone to help at night Then had ER visit---notes reviewed Was more regular with hydrocodone and now things have improved Now back to just prn No UTI though this was checked  Breathing is about the same Still DOE if walking around the house Can stand to brush teeth or hair and is okay Needs help now to get dressed ---still has help with bathing Oxygen now up to 4 l/min  No chest pain but does get "a little something in chest"--but associated with the dyspnea Hasn't needed NTG Does use the roxanol sometimes No palpitations No sig headaches  Stomach hurts at times No heartburn No trouble swallowing--but daughter notes occ gets "strangled" and coughing  Current Outpatient Prescriptions on File Prior to Visit  Medication Sig Dispense Refill  . albuterol (PROVENTIL HFA;VENTOLIN HFA) 108 (90 BASE) MCG/ACT inhaler Inhale 2 puffs into the lungs every 6 (six) hours as needed for wheezing.      Marland Kitchen albuterol (PROVENTIL) (2.5 MG/3ML) 0.083% nebulizer solution Take 2.5 mg by nebulization 4 (four) times daily. And can use an additional twice a day if needed      . ALPRAZolam (XANAX) 0.5 MG tablet Take 0.25-0.5 mg by mouth 3 (three) times daily as needed.       . budesonide (PULMICORT) 0.5 MG/2ML nebulizer solution Take 2 mLs (0.5 mg total) by nebulization 2 (two) times daily.  120 mL  11  . Calcium Carbonate-Vitamin D (CALCIUM 600+D) 600-400 MG-UNIT per tablet Take 1 tablet by mouth daily.      . cholecalciferol (VITAMIN D) 1000 UNITS tablet Take 1,000 Units by mouth daily.      . fish oil-omega-3 fatty acids 1000 MG capsule Take 2 g by mouth daily.        . furosemide (LASIX) 40 MG tablet Take 20 mg by mouth daily.      Marland Kitchen HYDROcodone-acetaminophen  (NORCO/VICODIN) 5-325 MG per tablet Take 1 tablet by mouth every 4 (four) hours as needed for pain.       . metoprolol succinate (TOPROL-XL) 25 MG 24 hr tablet Take 1 tablet (25 mg total) by mouth daily.  30 tablet  11  . morphine (ROXANOL) 20 MG/ML concentrated solution Take 0.25-1 mLs (5-20 mg total) by mouth every 2 (two) hours as needed for pain.  30 mL  0  . Multiple Vitamin (MULTIVITAMIN) capsule Take 1 capsule by mouth daily.        . nitroGLYCERIN (NITROSTAT) 0.4 MG SL tablet Place 1 tablet (0.4 mg total) under the tongue every 5 (five) minutes as needed.  25 tablet  2  . potassium chloride (KLOR-CON) 10 MEQ CR tablet Take 1 tablet (10 mEq total) by mouth daily.  30 tablet  11  . ranitidine (ZANTAC) 150 MG tablet Take 150 mg by mouth 2 (two) times daily.      Marland Kitchen senna-docusate (SENOKOT-S) 8.6-50 MG per tablet Take 1 tablet by mouth 2 (two) times daily.      . temazepam (RESTORIL) 15 MG capsule Take 15 mg by mouth at bedtime as needed for sleep.       Marland Kitchen warfarin (COUMADIN) 5 MG tablet Take 5 mg by mouth daily. 2.5 on Thursday       No current facility-administered medications on  file prior to visit.    Allergies  Allergen Reactions  . Cefuroxime     Abdominal discomfort and vomiting  . Ace Inhibitors Swelling  . Clindamycin     REACTION: rash  . Codeine Phosphate     REACTION: unspecified  . Tramadol Hcl     REACTION: visual hallucinations  . Penicillins Swelling and Rash  . Prednisone     Not allergic Can take but prefers not to--causes emotional changes    Past Medical History  Diagnosis Date  . Anxiety   . Hypertension   . Osteoporosis   . COPD (chronic obstructive pulmonary disease)   . CHF (congestive heart failure)     diastolic  . Atrial fibrillation   . History of colon polyps   . Peripheral vascular disease   . Renal insufficiency   . Constipation   . Seizure   . PERIPHERAL VASCULAR DISEASE 09/26/2006  . GERD (gastroesophageal reflux disease)     Past  Surgical History  Procedure Laterality Date  . Abdominal hysterectomy    . Appendectomy    . Lumbar disc surgery      repair  . Retinal detachment surgery    . Cataract extraction      Family History  Problem Relation Age of Onset  . Cancer Mother   . Cancer Father   . Cancer Sister     breast cancer  . Diabetes Son   . Cancer Sister     lung cancer    History   Social History  . Marital Status: Widowed    Spouse Name: N/A    Number of Children: 4  . Years of Education: N/A   Occupational History  . retired     Educational psychologist [employer]   Social History Main Topics  . Smoking status: Former Smoker    Types: Cigarettes  . Smokeless tobacco: Never Used  . Alcohol Use: Not on file  . Drug Use: Not on file  . Sexually Active: Not on file   Other Topics Concern  . Not on file   Social History Narrative   DNR done 10/06/11   Review of Systems Bowels okay--- cut back on senna now that she is taking less hydrocodone Appetite is not big--tries to eat 3 small meals a day and snack    Objective:   Physical Exam  Constitutional: She appears well-developed and well-nourished. No distress.  Neck: Normal range of motion. Neck supple.  Cardiovascular: Normal rate.  Exam reveals no gallop.   No murmur heard. irregular  Pulmonary/Chest: Effort normal. No respiratory distress. She has no wheezes. She has no rales.  Decreased breath sounds but clear Not tight  Abdominal: Soft. There is no tenderness.  Musculoskeletal: She exhibits no edema and no tenderness.  Mild lumbar spine tenderness  Lymphadenopathy:    She has no cervical adenopathy.  Skin:  Mycotic but short toenails Hard ulcer on top of right 4th toe---no infection          Assessment & Plan:

## 2012-05-08 NOTE — Assessment & Plan Note (Signed)
Neutral fluid status Probably exacerbates if HR increased---okay now

## 2012-05-08 NOTE — Assessment & Plan Note (Signed)
Occ spells with swallowing but nothing consistent Will continue the ranitidine only for now

## 2012-05-08 NOTE — Assessment & Plan Note (Signed)
Rate controlled On coumadin and monitored by hospice and Arline Asp RN in my office

## 2012-05-08 NOTE — Assessment & Plan Note (Signed)
Had bad spell requiring ER eval Better now No compression fracture Now back to hydrocodone prn only

## 2012-05-09 ENCOUNTER — Encounter: Payer: Self-pay | Admitting: Internal Medicine

## 2012-05-09 ENCOUNTER — Other Ambulatory Visit: Payer: Self-pay | Admitting: *Deleted

## 2012-05-09 MED ORDER — ALPRAZOLAM 0.5 MG PO TABS: 0.2500 mg | ORAL_TABLET | Freq: Three times a day (TID) | ORAL | Status: DC | PRN

## 2012-05-09 NOTE — Telephone Encounter (Signed)
Last filled 01/16/12

## 2012-05-09 NOTE — Telephone Encounter (Signed)
Spoke with [employer] and corrected instructions

## 2012-05-09 NOTE — Telephone Encounter (Signed)
No it is 1/2-1 tab tid prn

## 2012-05-09 NOTE — Telephone Encounter (Signed)
Okay #90 x 0 Sig changed yesterday I think

## 2012-05-09 NOTE — Telephone Encounter (Signed)
rx called into pharmacy Just to be sure, this is 1-2 ( 0.5-1mg )  tablets three times daily? It was 0.25-0.5 three times daily

## 2012-05-14 ENCOUNTER — Ambulatory Visit (INDEPENDENT_AMBULATORY_CARE_PROVIDER_SITE_OTHER): Admitting: General Practice

## 2012-05-14 LAB — POCT INR: INR: 2.2

## 2012-05-16 ENCOUNTER — Other Ambulatory Visit: Payer: Self-pay | Admitting: *Deleted

## 2012-05-16 MED ORDER — ALBUTEROL SULFATE (2.5 MG/3ML) 0.083% IN NEBU: 2.5000 mg | INHALATION_SOLUTION | Freq: Four times a day (QID) | RESPIRATORY_TRACT | Status: AC

## 2012-05-16 NOTE — Telephone Encounter (Signed)
Rx sent to pharmacy by escript  

## 2012-05-29 ENCOUNTER — Ambulatory Visit (INDEPENDENT_AMBULATORY_CARE_PROVIDER_SITE_OTHER): Admitting: General Practice

## 2012-05-29 ENCOUNTER — Encounter: Payer: Self-pay | Admitting: Internal Medicine

## 2012-05-29 LAB — PROTIME-INR: INR: 1.4 — AB (ref <=1.1)

## 2012-06-05 ENCOUNTER — Ambulatory Visit (INDEPENDENT_AMBULATORY_CARE_PROVIDER_SITE_OTHER): Admitting: General Practice

## 2012-06-05 ENCOUNTER — Encounter: Payer: Self-pay | Admitting: Internal Medicine

## 2012-06-07 DIAGNOSIS — J449 Chronic obstructive pulmonary disease, unspecified: Secondary | ICD-10-CM

## 2012-06-07 DIAGNOSIS — J4489 Other specified chronic obstructive pulmonary disease: Secondary | ICD-10-CM

## 2012-06-07 DIAGNOSIS — I5032 Chronic diastolic (congestive) heart failure: Secondary | ICD-10-CM

## 2012-06-13 ENCOUNTER — Other Ambulatory Visit: Payer: Self-pay

## 2012-06-13 MED ORDER — MORPHINE SULFATE (CONCENTRATE) 20 MG/ML PO SOLN: 10.0000 mg | ORAL | Status: DC | PRN

## 2012-06-13 NOTE — Telephone Encounter (Signed)
rx faxed to [facility]

## 2012-06-13 NOTE — Telephone Encounter (Signed)
[name] [name] with [facility] request rx for Morphine sulfate liquid sent to [facility] and to change directions for frequency to 1-2 hours with larger quantity if possible;pt having more SOB upon exertion.Please advise. [name] [name] does not need call back.

## 2012-06-18 ENCOUNTER — Ambulatory Visit (INDEPENDENT_AMBULATORY_CARE_PROVIDER_SITE_OTHER): Payer: Self-pay | Admitting: General Practice

## 2012-06-18 DIAGNOSIS — Z7901 Long term (current) use of anticoagulants: Secondary | ICD-10-CM

## 2012-06-18 NOTE — Patient Instructions (Addendum)
Continue to take 1 tablet all days except 1/2 tablet on Thursdays.  Re-check in 1 week.  Orders given to [place], RN @ Hospice.

## 2012-06-25 ENCOUNTER — Telehealth: Payer: Self-pay | Admitting: *Deleted

## 2012-06-25 MED ORDER — MORPHINE SULFATE ER 15 MG PO TBCR: 15.0000 mg | EXTENDED_RELEASE_TABLET | Freq: Two times a day (BID) | ORAL | Status: DC

## 2012-06-25 NOTE — Telephone Encounter (Signed)
Chip Boer at [facility] calling stating that pt is using liquid morphine 3-4 times daily and is requesting 15mg  morphine sulfate extented release tabs for every 12 hours instead? Please advise

## 2012-06-25 NOTE — Telephone Encounter (Signed)
That sounds like a good idea.

## 2012-06-26 NOTE — Telephone Encounter (Signed)
Spoke with hospice nurse and advised results rx faxed to pharmacy manually    

## 2012-07-02 LAB — PROTIME-INR

## 2012-07-03 ENCOUNTER — Ambulatory Visit (INDEPENDENT_AMBULATORY_CARE_PROVIDER_SITE_OTHER): Payer: Medicare Other | Admitting: General Practice

## 2012-07-03 ENCOUNTER — Encounter: Payer: Self-pay | Admitting: Internal Medicine

## 2012-07-03 LAB — POCT INR: INR: 2

## 2012-07-03 NOTE — Patient Instructions (Signed)
Continue to take 1 tablet all days except 1/2 tablet on Thursdays.  Re-check in 3 weeks.  Orders given to [place], RN @ Hospice.

## 2012-07-09 ENCOUNTER — Telehealth: Payer: Self-pay

## 2012-07-09 NOTE — Telephone Encounter (Signed)
I am not sure what to treat at this point.  This certainly can be from the morphine tablets since her confusion worsened on this I recommend that we stop those and go back to just using the roxanol liquid Need to limit alprazolam dosing as well if she is taking that regularly

## 2012-07-09 NOTE — Telephone Encounter (Signed)
Vicky with [facility] left v/m; pt having increased confusion and pt's family having difficult time handling. This weekend pt was very disorientated, tearful at times,pt does not know where she is; family re orients pt often. This morning some confusion but not as bad as the weekend. This morning pt is alert but heart rate is thready at 60 and respirations slightly down. Does Dr Alphonsus Sias want to treat;Please advise.

## 2012-07-10 NOTE — Telephone Encounter (Signed)
Have them continue the alprazolam I will plan to come see her next Wednesday afternoon--I don't think I can get out there sooner (although if tomorrow goes well at [facility] I might be able to stop there Friday afternoon)

## 2012-07-10 NOTE — Telephone Encounter (Signed)
Spoke with [name] [name] at hospice and the family had already stopped the morphine and started back the roxanol liquid and per [name] [name] they family states the confusion had started before the morphine they stopped it because she was so sleepy, also [name] [name] states they can't cut back on the alprazolam because that's all they have to calm her down. Please advise

## 2012-07-10 NOTE — Telephone Encounter (Signed)
Spoke with [name] [name] and advised results

## 2012-07-12 ENCOUNTER — Ambulatory Visit: Payer: Medicare Other | Admitting: Internal Medicine

## 2012-07-12 ENCOUNTER — Encounter: Payer: Self-pay | Admitting: Internal Medicine

## 2012-07-12 VITALS — BP 140/62 | HR 64 | Resp 16 | Wt 122.0 lb

## 2012-07-12 DIAGNOSIS — F22 Delusional disorders: Secondary | ICD-10-CM

## 2012-07-12 DIAGNOSIS — I4891 Unspecified atrial fibrillation: Secondary | ICD-10-CM

## 2012-07-12 DIAGNOSIS — J449 Chronic obstructive pulmonary disease, unspecified: Secondary | ICD-10-CM

## 2012-07-12 DIAGNOSIS — I5032 Chronic diastolic (congestive) heart failure: Secondary | ICD-10-CM

## 2012-07-12 DIAGNOSIS — F411 Generalized anxiety disorder: Secondary | ICD-10-CM

## 2012-07-12 NOTE — Assessment & Plan Note (Signed)
Rate control is good Given worsened pulmonary status---will try off the metoprolol If rate goes up, will restart (or consider diltiazem)

## 2012-07-12 NOTE — Assessment & Plan Note (Signed)
This is worse and may be related to her worsening status She has talked about dying with daughter---not sure if just fear or what Will have them increase the alprazolam if her spells persist

## 2012-07-12 NOTE — Assessment & Plan Note (Signed)
Weight is up some despite poor appetite Will increase the furosemide

## 2012-07-12 NOTE — Assessment & Plan Note (Signed)
Has had more dyspnea lately Weight is up---will increase the furosemide in case this is pulmonary fluid Continues on same regimen roxanol for dyspnea---will increase the dose Hold off on MS contin for now since it caused initial sedation (may want to get back to it though)

## 2012-07-12 NOTE — Progress Notes (Signed)
Subjective:    Patient ID: [name] [name], female    DOB: 1921-01-02, 79 y.o.   MRN: [id number]  HPI Early follow up due to mental status changes 2 daughters here [place]-- hospice RN is here Has 24 hour care---daughters and hired help  Has had some confusion Thought son was young man Heard someone under the house Sometimes doesn't think she is in her own home (and she admits that she can't figure out where she is --especially at night) These changes predated starting the MS contin  Has been SOB at times Gets periods of severe dyspnea---does use the roxanol Even walking from bedroom to bathroom --or even just moving bowels Only using 0.25cc at a time Only occasional cough  Does get palpitations at times Not using the nitro  No apparent edema but weight up  Current Outpatient Prescriptions on File Prior to Visit  Medication Sig Dispense Refill  . albuterol (PROVENTIL HFA;VENTOLIN HFA) 108 (90 BASE) MCG/ACT inhaler Inhale 2 puffs into the lungs every 6 (six) hours as needed for wheezing.      Marland Kitchen albuterol (PROVENTIL) (2.5 MG/3ML) 0.083% nebulizer solution Take 3 mLs (2.5 mg total) by nebulization 4 (four) times daily. And can use an additional twice a day if needed  75 mL  5  . budesonide (PULMICORT) 0.5 MG/2ML nebulizer solution Take 2 mLs (0.5 mg total) by nebulization 2 (two) times daily.  120 mL  11  . cholecalciferol (VITAMIN D) 1000 UNITS tablet Take 1,000 Units by mouth daily.      . furosemide (LASIX) 40 MG tablet Take 20 mg by mouth daily.      Marland Kitchen HYDROcodone-acetaminophen (NORCO/VICODIN) 5-325 MG per tablet Take 1 tablet by mouth every 4 (four) hours as needed for pain.       . metoprolol succinate (TOPROL-XL) 25 MG 24 hr tablet Take 1 tablet (25 mg total) by mouth daily.  30 tablet  11  . morphine (MS CONTIN) 15 MG 12 hr tablet Take 1 tablet (15 mg total) by mouth 2 (two) times daily.  60 tablet  0  . morphine (ROXANOL) 20 MG/ML concentrated solution Take 0.5-1 mLs (10-20 mg  total) by mouth every hour as needed for pain.  60 mL  0  . nitroGLYCERIN (NITROSTAT) 0.4 MG SL tablet Place 1 tablet (0.4 mg total) under the tongue every 5 (five) minutes as needed.  25 tablet  2  . ranitidine (ZANTAC) 150 MG tablet Take 150 mg by mouth 2 (two) times daily.      Marland Kitchen senna-docusate (SENOKOT-S) 8.6-50 MG per tablet Take 1 tablet by mouth 2 (two) times daily.      . temazepam (RESTORIL) 15 MG capsule Take 15 mg by mouth at bedtime as needed for sleep.       Marland Kitchen warfarin (COUMADIN) 5 MG tablet Take 5 mg by mouth daily. 2.5 on Thursday       No current facility-administered medications on file prior to visit.    Allergies  Allergen Reactions  . Cefuroxime     Abdominal discomfort and vomiting  . Ace Inhibitors Swelling  . Clindamycin     REACTION: rash  . Codeine Phosphate     REACTION: unspecified  . Tramadol Hcl     REACTION: visual hallucinations  . Penicillins Swelling and Rash  . Prednisone     Not allergic Can take but prefers not to--causes emotional changes    Past Medical History  Diagnosis Date  . Anxiety   . Hypertension   .  Osteoporosis   . COPD (chronic obstructive pulmonary disease)   . CHF (congestive heart failure)     diastolic  . Atrial fibrillation   . History of colon polyps   . Peripheral vascular disease   . Renal insufficiency   . Constipation   . Seizure   . PERIPHERAL VASCULAR DISEASE 09/26/2006  . GERD (gastroesophageal reflux disease)     Past Surgical History  Procedure Laterality Date  . Abdominal hysterectomy    . Appendectomy    . Lumbar disc surgery      repair  . Retinal detachment surgery    . Cataract extraction      Family History  Problem Relation Age of Onset  . Cancer Mother   . Cancer Father   . Cancer Sister     breast cancer  . Diabetes Son   . Cancer Sister     lung cancer    History   Social History  . Marital Status: Widowed    Spouse Name: N/A    Number of Children: 4  . Years of Education:  N/A   Occupational History  . retired     Educational psychologist [employer]   Social History Main Topics  . Smoking status: Former Smoker    Types: Cigarettes  . Smokeless tobacco: Never Used  . Alcohol Use: Not on file  . Drug Use: Not on file  . Sexually Active: Not on file   Other Topics Concern  . Not on file   Social History Narrative   DNR done 10/06/11   Review of Systems No dysuria or increased frequency No skin areas of concern for infection Sleeps okay Appetite is not so great--but weight is up a few pounds No fever Doesn't seem to be sick    Objective:   Physical Exam  Constitutional: No distress.  Alert in her chair  Neck: Normal range of motion. Neck supple.  Cardiovascular: Normal rate.  Exam reveals no gallop.   Murmur heard. Irregular Heard best subxiphoid Soft systolic murmur at base  Pulmonary/Chest: No respiratory distress. She has no wheezes. She has no rales.  Decreased breath sounds but clear No dullness to percussion  Lymphadenopathy:    She has no cervical adenopathy.  Psychiatric: She has a normal mood and affect. Her behavior is normal.  Appropriate interaction          Assessment & Plan:

## 2012-07-12 NOTE — Assessment & Plan Note (Addendum)
No evidence of infection  sats were okay recently  Doesn't seem to be related to meds  No delerium This could be related to vascular ischemia and be a manifestation of subclinical strokes If so, not much more to do in terms of therapy  Did get very upset recently with the delusions (like doesn't think she is home)----if this worsens, could try low dose seroquel or other antipsychotic Will try the alprazolam at higher dose first

## 2012-07-18 IMAGING — CR DG CHEST 1V PORT
1 series · 1 of 1 positions shown · non-contrast
Comparison: none

REASON FOR EXAM: shortness of breath
COMMENTS:

PROCEDURE:     DXR - DXR PORTABLE CHEST SINGLE VIEW  - June 10, 2011  [DATE]
RESULT:     Comparison: 02/14/2011

[portable]
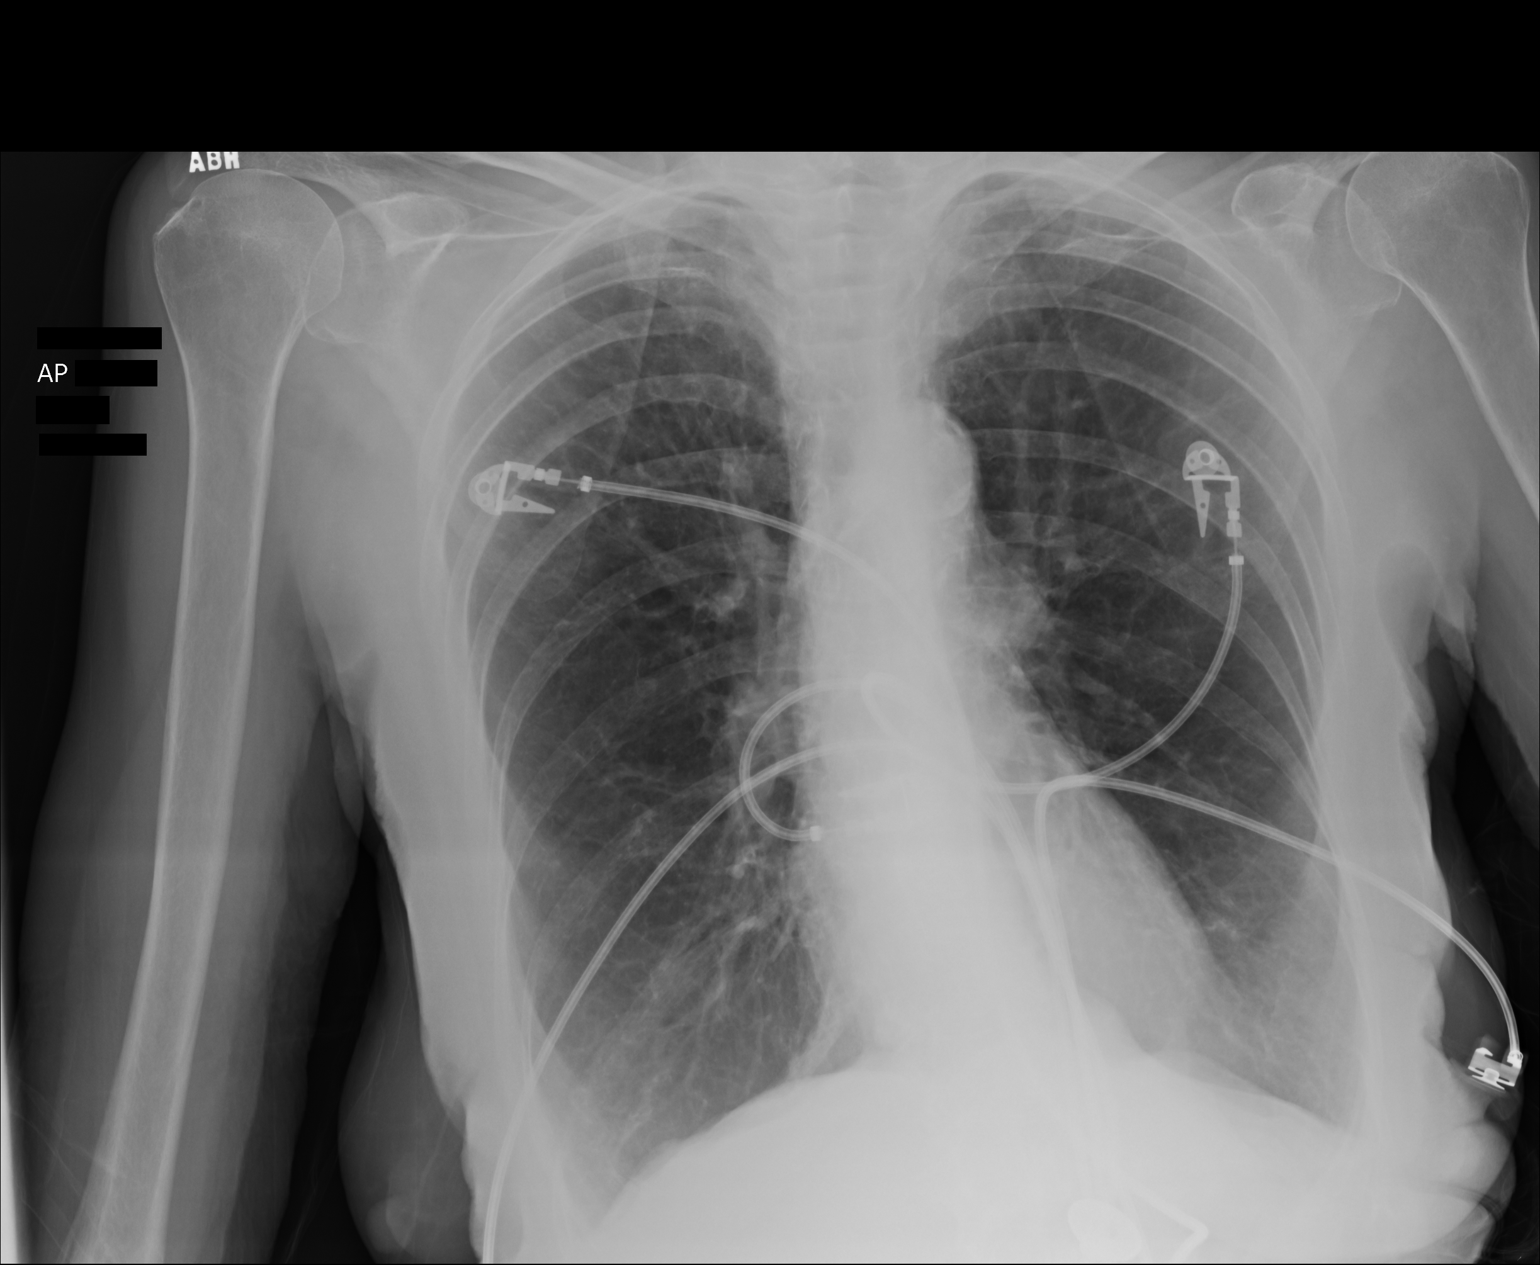

[1 of 1 positions shown; findings below may reference images not displayed]

FINDINGS: Single portable AP chest radiograph is provided. The lungs are hyperinflated
likely secondary to COPD. There is no focal parenchymal opacity, pleural
effusion, or pneumothorax. Normal cardiomediastinal silhouette. The osseous
structures are unremarkable.
IMPRESSION: No acute disease of the che[REDACTED]

## 2012-07-24 ENCOUNTER — Telehealth: Payer: Self-pay | Admitting: Internal Medicine

## 2012-07-24 ENCOUNTER — Telehealth: Payer: Self-pay | Admitting: *Deleted

## 2012-07-24 MED ORDER — MORPHINE SULFATE (CONCENTRATE) 20 MG/ML PO SOLN: 10.0000 mg | ORAL | Status: AC | PRN

## 2012-07-24 NOTE — Telephone Encounter (Signed)
[name] [name] with [facility] left v/m; request  Faxed refill liquid morphine to [facility]. [name] [name] said pt condition has drastically changed; increased lung sounds today; a lot of rattling, started atropine drops. Catheter in with minimal output and no in take.Bed bound. Pt is alert but very confused.

## 2012-07-24 NOTE — Telephone Encounter (Signed)
Go ahead and fax Rx

## 2012-07-24 NOTE — Telephone Encounter (Signed)
Spoke with hospice nurse and advised results  

## 2012-07-24 NOTE — Telephone Encounter (Signed)
Spoke with hospice nurse and they will cover this medication

## 2012-07-24 NOTE — Telephone Encounter (Signed)
Left message to have [name] [name] return my call, pt's furosemide was denied by [facility]. Per Dr.Letvak hospice should cover this medication.

## 2012-07-29 ENCOUNTER — Telehealth: Payer: Self-pay | Admitting: Family Medicine

## 2012-07-29 NOTE — Telephone Encounter (Signed)
Received death certificate to sign.  Signed and placed in my out box. End stage O2 dependent COPD

## 2012-07-30 ENCOUNTER — Ambulatory Visit: Payer: Self-pay | Admitting: General Practice

## 2012-07-30 NOTE — Patient Instructions (Signed)
Patient expired.

## 2012-07-31 NOTE — Telephone Encounter (Signed)
That is correct 

## 2012-08-13 DEATH — deceased

## 2012-10-22 IMAGING — CR DG CHEST 1V PORT
1 series · 1 of 1 positions shown · non-contrast
Comparison: none

REASON FOR EXAM: resp failure
COMMENTS:

PROCEDURE:     DXR - DXR PORTABLE CHEST SINGLE VIEW  - September 14, 2011  [DATE]
RESULT:     Comparison: 09/13/2011, 06/10/2011

[portable]
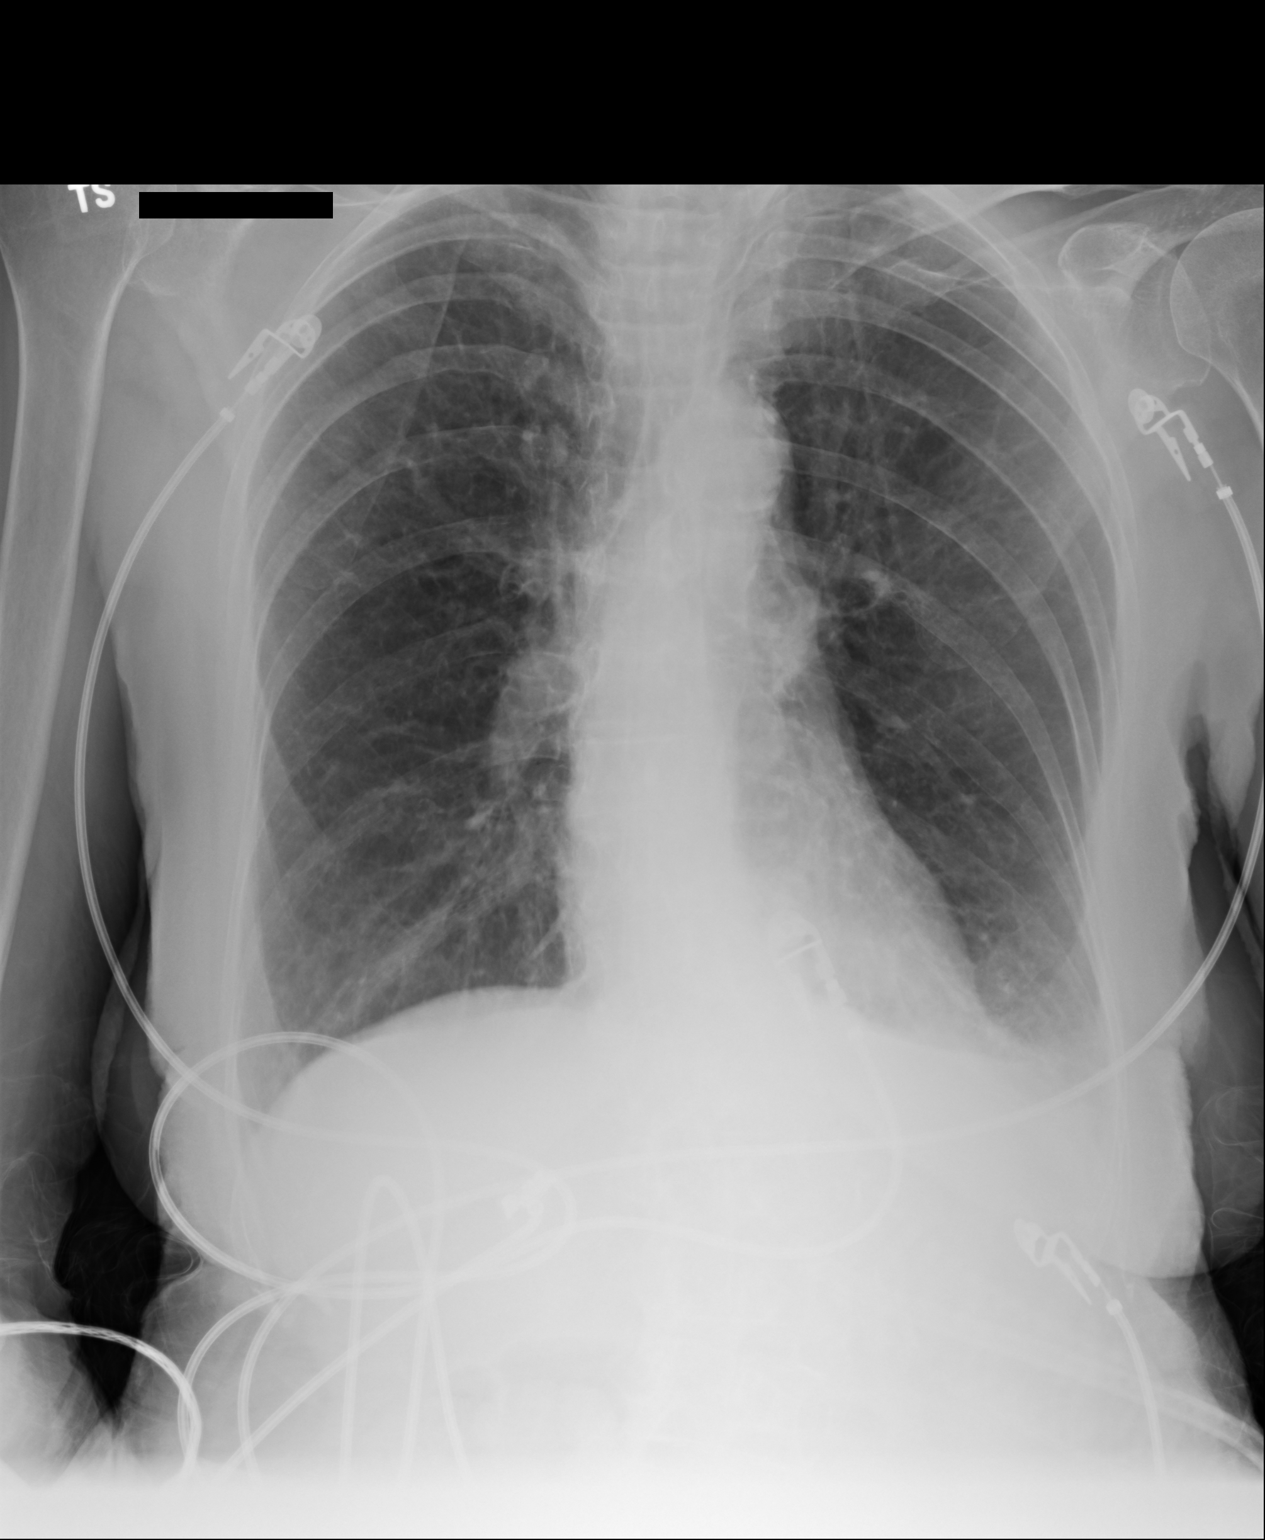

[1 of 1 positions shown; findings below may reference images not displayed]

FINDINGS: Single portable AP chest radiograph is provided. There is a trace left
pleural effusion. There is persistent minimal airspace disease at the left
lung base improved compared with the prior exam. The right lung is clear.
The lungs are hyperinflated likely secondary to COPD. Normal
cardiomediastinal silhouette. The osseous structures are unremarkable.
IMPRESSION: Mild interval improvement of left lower lobe airspace disease. Continued
followup recommended.

[REDACTED]

## 2013-01-29 IMAGING — CR DG CHEST 2V
1 series · 2 of 2 positions shown · non-contrast
Comparison: none

REASON FOR EXAM: SOB
COMMENTS:

PROCEDURE:     DXR - DXR CHEST PA (OR AP) AND LATERAL  - December 22, 2011  [DATE]
RESULT:     Comparison: 09/14/2011

[Series 1: ap · 0.17mm/px · 2 of 2 slices shown]
[im 1/2]
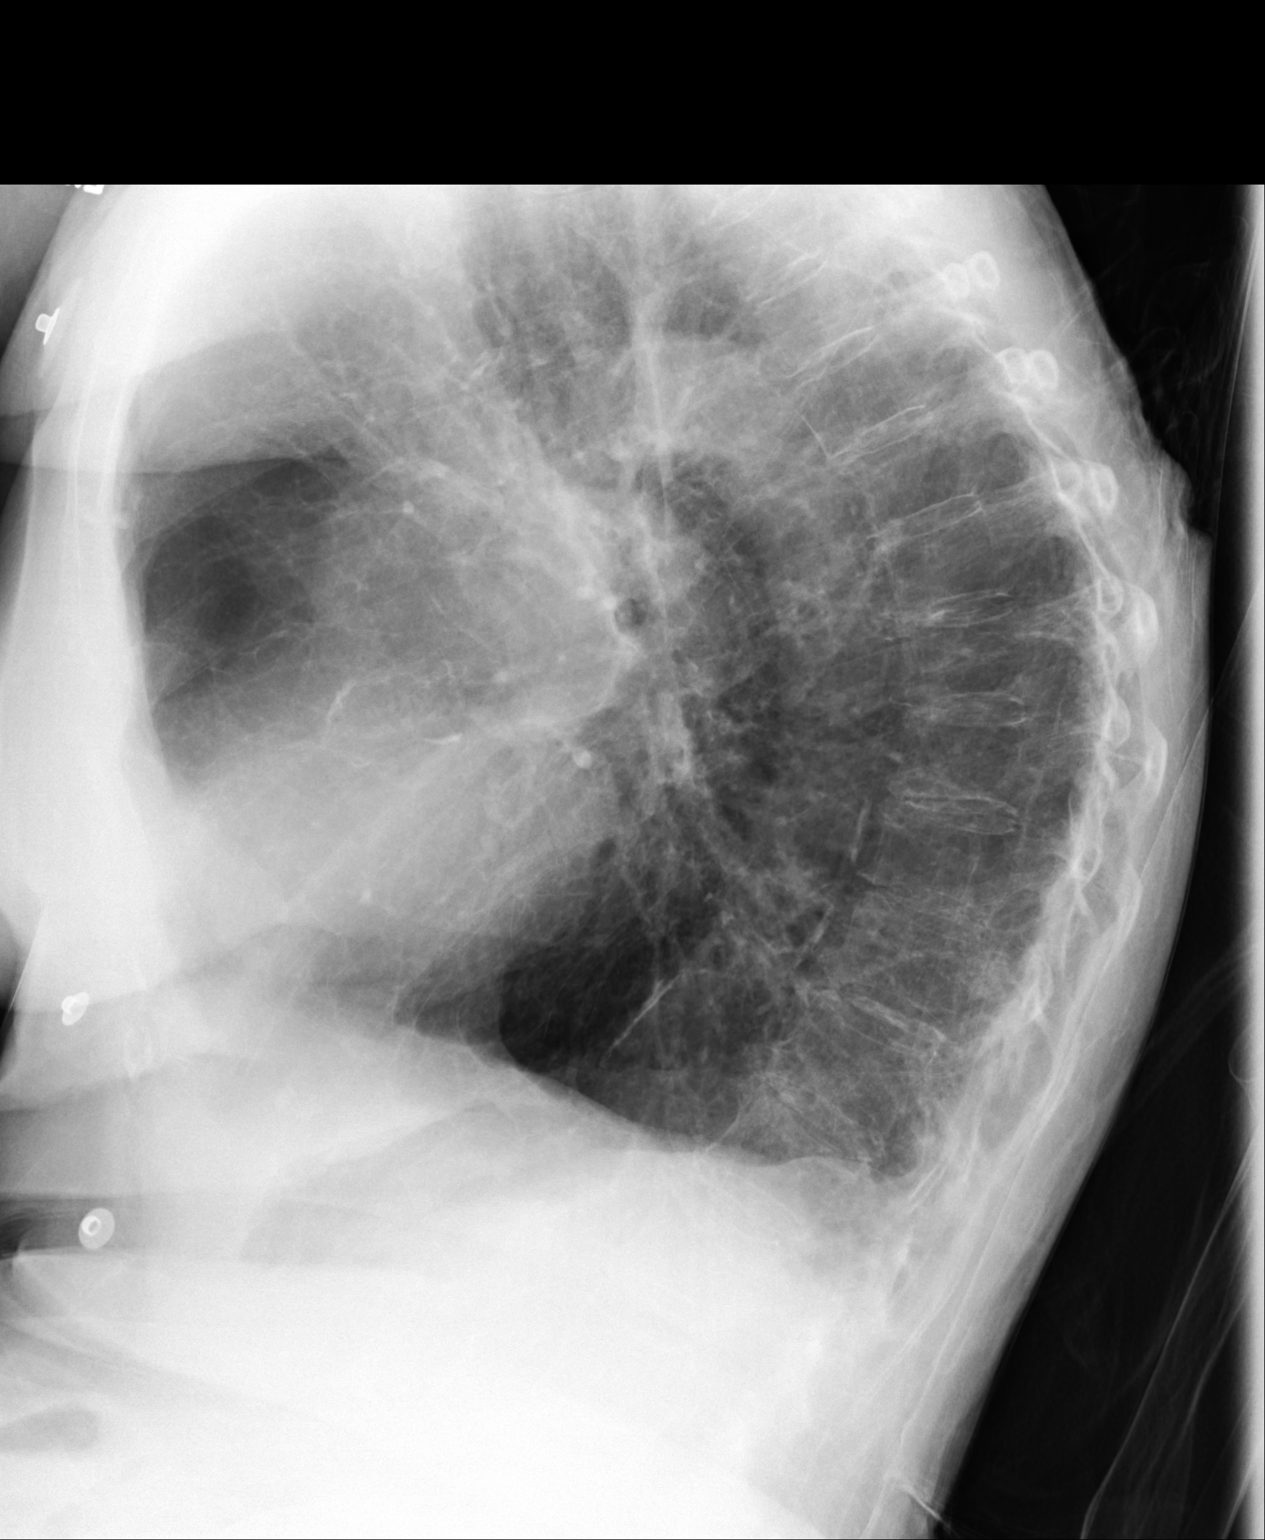
[im 2/2]
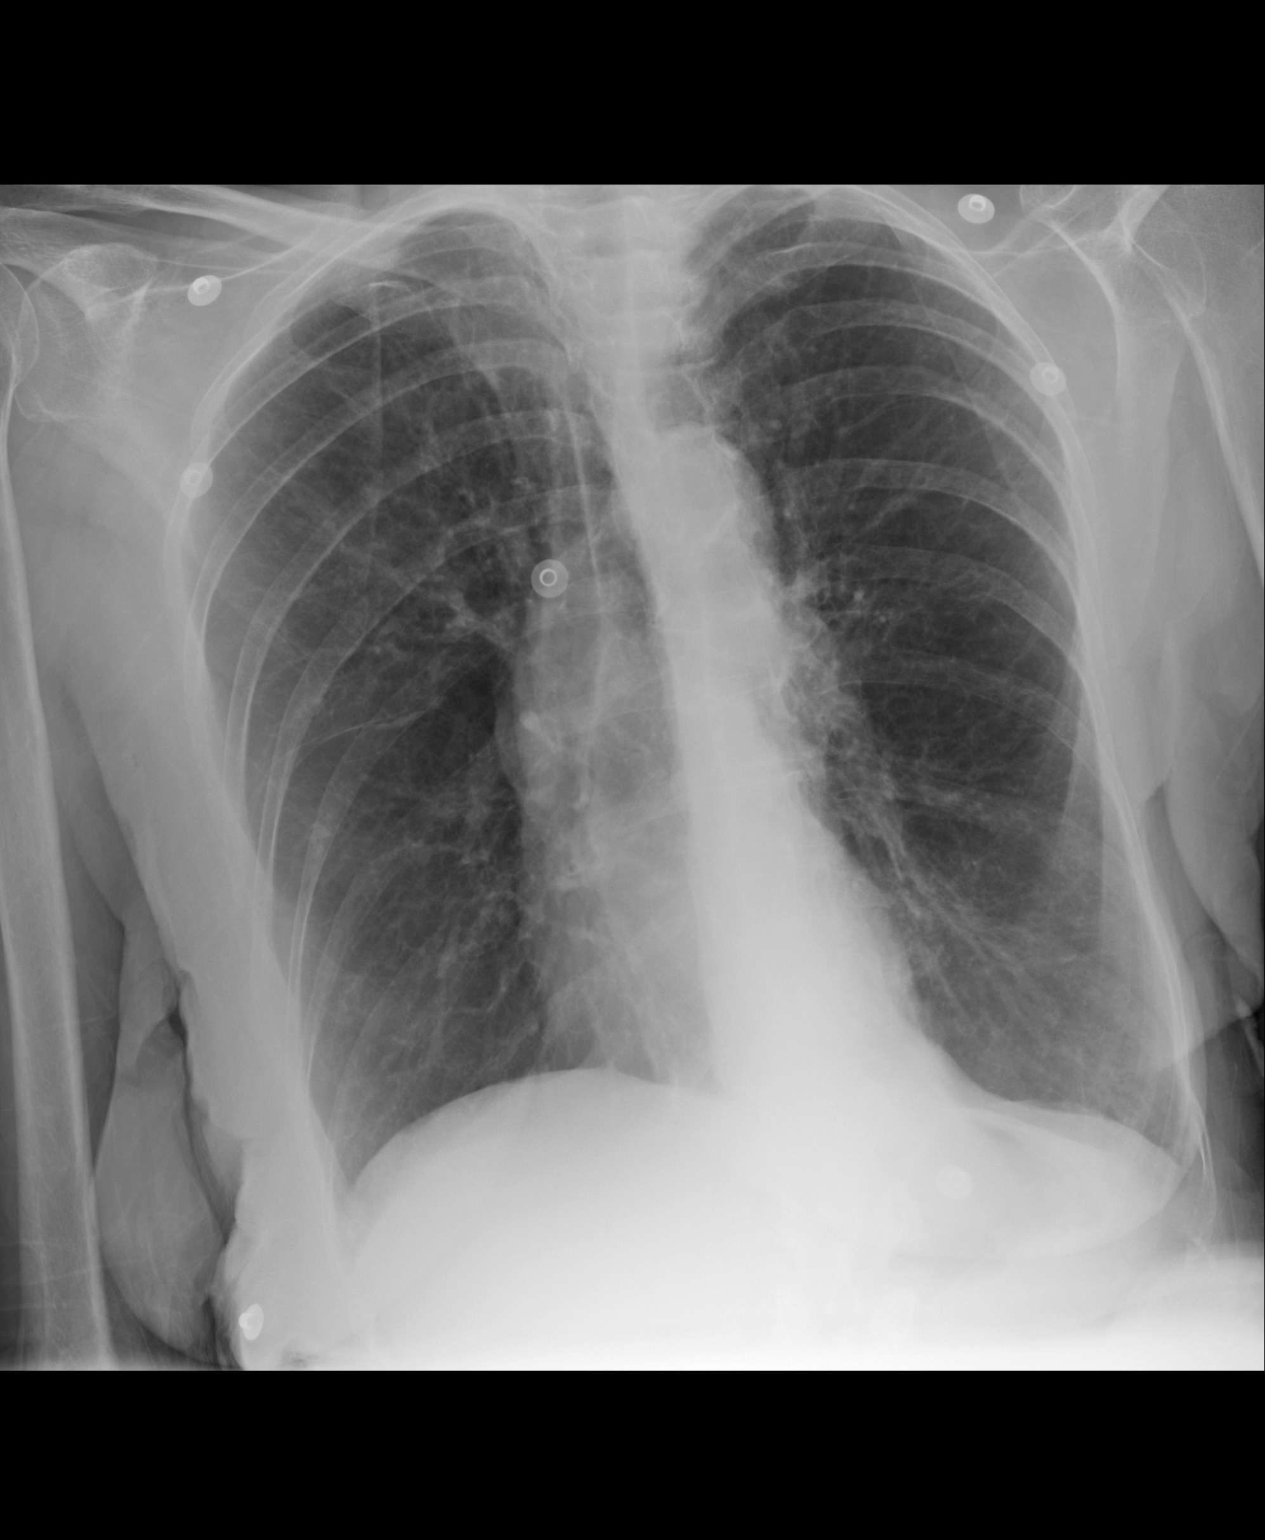

[2 of 2 positions shown; findings below may reference images not displayed]

FINDINGS: PA and lateral chest radiographs are provided. The lungs are hyperinflated
likely secondary to COPD. There is no focal parenchymal opacity, pleural
effusion, or pneumothorax. The heart and mediastinum are unremarkable.  The
osseous structures are unremarkable.
IMPRESSION: No acute disease of the che[REDACTED]

## 2014-06-02 NOTE — Discharge Summary (Signed)
PATIENT NAME:  [name] [name] MR#:  [id number] DATE OF BIRTH:  [birth year]  DATE OF ADMISSION:  12/22/2011 DATE OF DISCHARGE:  12/23/2011  PRESENTING COMPLAINT: "My daughter insisted [name] come."   DISCHARGE DIAGNOSES:  1. Mild chronic obstructive pulmonary disease exacerbation, improved.  2. Acute on chronic respiratory failure due to chronic obstructive pulmonary disease flare.  3. Chronic home oxygen.   CONDITION ON DISCHARGE: Fair.   CODE STATUS: NO CODE, DO NOT RESUSCITATE.   MEDICATIONS:  1. Ranitidine 150 b.[name].d.  2. Augmentin 875 mg p.o. b.[name].d. 3. Warfarin 5 mg, 1/2 tablet twice a week on Monday and Thursday and 1 tablet on Sunday, Tuesday, Wednesday, Friday, Saturday.  4. ProAir HFA 2 puffs every 4 hours.  5. Albuterol SVN 3 mL every 4 hours as needed.  6. Xanax 0.5 mg b.[name].d. as needed.  7. Os-Cal with vitamin D, 1 tablet b.[name].d.  8. Omega-3 fatty acid 1000 mg p.o. b.[name].d.  9. Lasix 40 mg, 1/2 tablet p.o. daily.  10. Combivent Respimat 1 puff every 6 hours as needed.  11. Metoprolol ER 25 mg p.o. daily.  12. Nitroglycerin 0.4 mg sublingual as needed.  13. K-Dur 10 mEq p.o. daily.   FOLLOWUP: Follow up with Dr. Silvio Pate in 1 to 2 weeks.   LABORATORY, DIAGNOSTIC AND RADIOLOGICAL DATA: PT-INR is 20.5 and 1.7. Chest x-ray is consistent with emphysema. No pneumonia. Cardiac enzymes negative. Hemoglobin and hematocrit 11.7 and 34.6, platelet count is 154. Glucose is 185, BUN is 26, creatinine 1.06, sodium 138, potassium 4.4, chloride is 99, bicarbonate 33. EKG shows sinus rhythm with PACs.   BRIEF SUMMARY OF HOSPITAL COURSE: [name] is an 81 year old Caucasian female who has known history of chronic respiratory failure on home oxygen, comes in with:   1. Acute on chronic respiratory failure secondary to chronic obstructive pulmonary disease exacerbation: The patient is at baseline, requesting to go home on the second day.  Her daughter was agreeable with it.   2. Acute chronic  obstructive pulmonary disease exacerbation: Continue p.o. steroids, nebulizers, and Augmentin prescribed as outpatient. Chest x-ray did not show any evidence of a pneumonia.  3. History of atrial fibrillation: On Coumadin.  4. Hypertension: We will continue her outpatient medications.  5. History of diastolic heart failure: Seems to be stable at this point. The patient seems to be euvolemic and not decompensated. We will continue her Lasix and metoprolol.   The discharge plans were discussed with the patient's daughter, who is agreeable to it. The patient will follow up with Dr. Silvio Pate as an outpatient.   TIME SPENT: 40 minutes.   ____________________________ Hart Rochester Posey Pronto, MD sap:cbb D: 12/26/2011 01:12:31 ET T: 12/26/2011 13:45:25 ET JOB#: [id number]  cc: Jmarion Christiano A. Posey Pronto, MD, <Dictator> Venia Carbon, MD Ilda Basset MD ELECTRONICALLY SIGNED 01/01/2012 10:24

## 2014-06-02 NOTE — H&P (Signed)
PATIENT NAME:  [name] [name], [name] [name] MR#:  [id number] DATE OF BIRTH:  [birth year]  DATE OF ADMISSION:  12/22/2011  PRIMARY CARE PHYSICIAN: Viviana Simpler, [facility].   CHIEF COMPLAINT: "My daughter insisted [name] come."   HISTORY OF PRESENT ILLNESS: This is an 81 year old female with a history of chronic respiratory failure on 3 liters of oxygen at home who presents with three days of increasing dyspnea on exertion, shortness of breath, wheezing, and nonproductive cough. The patient is on Hospice at home. Yesterday Hospice came to see her, and she was placed on Augmentin. She continues to do poorly so she came to the ER for further evaluation. In the ER she was given nebulizers and steroids, and patient remains short of breath so hospitalist service was asked to see for possible ablation.   PAST MEDICAL HISTORY:  1. Chronic obstructive pulmonary disease on 3 liters oxygen.  2. Chronic atrial fibrillation, on Coumadin.  3. History of diastolic congestive heart failure.  4. Recent hospitalization in early August for  pneumonia and chronic obstructive pulmonary disease exacerbation.   PAST SURGICAL HISTORY:  1. Status post spinal surgery of lumbar spine.  2. Hysterectomy.  3. Appendectomy.  4. Cataract surgery.   MEDICATIONS:  1. Advair Diskus 1 puff b.[name].d.  2. Albuterol p.r.n.  3. Xanax 0.5 mg b.[name].d. p.r.n.  4. Combivent p.r.n. shortness of breath. 5. Lasix 20 mg daily.  6. Metoprolol 25 mg daily.  7. Multivitamin 1 tablet daily.  8. Nitroglycerin 0.4 mg p.r.n. chest pain.  9. Omega-3 fatty acid 1 tablet b.[name].d.   10. Os-Cal plus vitamin D 1 tablet p.o. b.[name].d.  11. K-Chlor 10 mEq daily.  12. ProAir 2 puffs q.4 hours p.r.n.  13. Coumadin 5 mg daily.   SOCIAL HISTORY: Remote history of smoking. Patient is on Hospice care.   FAMILY HISTORY: No history of hypertension, diabetes.   REVIEW OF SYSTEMS: CONSTITUTIONAL: No fever.  Positive fatigue, weakness.  No weight loss or gain.  EYES:  No blurred or  double vision, glaucoma.  ENT: No ear pain. Positive hearing loss. No seasonal allergies, postnasal drip. No snoring. RESPIRATORY: Positive cough. Positive wheezing. Positive shortness of breath. Positive history of chronic obstructive pulmonary disease on 3 liters oxygen. CARDIOVASCULAR: No chest pain, orthopnea, edema, arrhythmia. Positive dyspnea on exertion. No palpitations. GI: No vomiting, diarrhea, abdominal pain, melena, or ulcers. GU: No history of hematuria.  ENDOCRINE: No polyuria or polydipsia.  HEME/LYMPH: No easy bruising.  SKIN: No rash or lesions.  MUSCULOSKELETAL: She does have limited activity due to her age.  NEUROLOGIC: No history of ataxia or seizures.  PSYCHIATRIC: Patient does have a history of anxiety.  PHYSICAL EXAMINATION:   VITAL SIGNS: Temperature 98.6, pulse 72, respirations 33, blood pressure 135/44, 95% on room air.   GENERAL: The patient is alert, oriented, does not appear to be in acute distress. Her respiratory rate appears to be about 22.   HEENT: Head is atraumatic. Pupils are round and reactive. Sclerae anicteric. Mucous membranes are dry. Oropharynx is clear.   NECK: Supple.  No JVD, carotid bruit, or enlarged thyroid.   CARDIOVASCULAR: Regular rate and rhythm. No murmurs, gallops, rubs. PMI is not displaced.   LUNGS: Decreased movement throughout, very tight. No audible wheezing is heard. Diminished breath sounds throughout. No dullness to percussion.  BACK: No CVA or vertebral tenderness.  ABDOMEN: Bowel sounds are positive, nontender and nondistended. No hepatosplenomegaly.    EXTREMITIES: No clubbing, cyanosis, or edema.  SKIN: Without rash or lesions.  NEUROLOGIC: Cranial nerves II-XII are grossly intact. No focal deficits.   LABORATORIES: Troponin less than 0.02. CK 56, CK-MB 1.4. White blood cells 7.7, hemoglobin 11.7, hematocrit 35, platelets are 154,000. Sodium 138, potassium 4.4, chloride 99, bicarbonate 33, BUN 26, creatinine 1.06, glucose  185. Calcium is 9.1.   Chest x-ray shows no acute cardiopulmonary disease.   EKG normal sinus rhythm without ST elevations or depressions.   ASSESSMENT AND PLAN: An 81 year old female with a history of chronic obstructive pulmonary disease exacerbation on 3 liters of oxygen and on Hospice who comes in with acute chronic obstructive pulmonary disease exacerbation.  1. Acute on chronic respiratory failure secondary to acute chronic obstructive pulmonary disease exacerbation as outlined below.  2. Acute chronic obstructive pulmonary disease exacerbation. The patient sounds very tight at this time. We will continue IV steroids, nebulizers, and Levaquin. Chest x-ray does not show any evidence of a pneumonia.    3. History of atrial fibrillation, on Coumadin. We will check an INR and adjust Coumadin accordingly.  4. Hypertension. We will continue her outpatient medications. 5. History of diastolic heart failure, which seems to be stable at this point.  Patient seems to be euvolemic and not decompensated. We will continue her Lasix and metoprolol.            CODE STATUS: THE PATIENT IS A DO NOT RESUSCITATE STATUS.  TIME SPENT: Approximately 45 minutes.  Expected length of stay is greater than to midnight.     ____________________________ Benedetto Ryder P. Benjie Karvonen, MD spm:vtd D: 12/22/2011 22:20:05 ET T: 12/23/2011 07:32:26 ET JOB#: [id number]  cc: Mirel Hundal P. Benjie Karvonen, MD, <Dictator> Venia Carbon, MD Donell Beers Kanyia Heaslip MD ELECTRONICALLY SIGNED 01/05/2012 21:27

## 2014-06-02 NOTE — Discharge Summary (Signed)
PATIENT NAME:  [name] [name], [name] [name] MR#:  [id number] DATE OF BIRTH:  05-31-1920  DATE OF ADMISSION:  09/13/2011 DATE OF DISCHARGE:  09/17/2011  For a detailed note, please take a look at the history and physical done on admission by Dr. Dustin Flock.   DIAGNOSES AT DISCHARGE:  1. Acute respiratory failure secondary to chronic obstructive pulmonary disease exacerbation.  2. Chronic obstructive pulmonary disease exacerbation secondary to pneumococcal pneumonia.  3. Sepsis secondary to pneumococcal pneumonia.  4. Hypertension.  5. Chronic atrial fibrillation.  6. Anxiety.   CODE STATUS: Patient is a DO NOT INTUBATE, DO NOT RESUSCITATE.   DIET: Patient is being discharged on a low-sodium, low-fat diet.   ACTIVITY: As tolerated.   FOLLOW UP: Follow up with Dr. Silvio Pate in next 1 to 2 weeks.   DISCHARGE MEDICATIONS:  1. Multivitamin daily.  2. Albuterol inhaler 2 puffs every four hours as needed.  3. Xanax 0.5 mg at bedtime as needed. 4. Os-Cal with vitamin D 1 tab b.[name].d.  5. Digoxin 125 mcg daily.  6. Omega-3 fatty acids 1 tab b.[name].d.  7. Advair 115/21, 1 puff b.[name].d.  8. Lasix 20 mg daily.  9. Combivent q.6 hours as needed. 10. Metoprolol succinate 25 mg daily.  11. Sublingual nitroglycerin as needed.  12. Potassium 10 mEq daily. 13. Vitamin B12 1000 mcg daily.  14. Warfarin 5 mg daily to be held tonight and tomorrow night.  15. Xanax 0.5 mg b.[name].d. as needed.  16. Ceftin 500 mg b.[name].d. x10 days.   LABORATORY, DIAGNOSTIC AND RADIOLOGICAL DATA: Chest x-ray done on 07/31 showing left lower lobe pneumonia. Blood culture is positive on 07/31 growing streptococcus pneumoniae.   HOSPITAL COURSE: This is an 81 year old female with medical problems as mentioned above presented to the hospital on 09/13/2011 secondary to shortness of breath and noted to have a left lower lobe pneumonia.  1. Acute respiratory failure. This was likely secondary to chronic obstructive pulmonary disease exacerbation  from underlying pneumonia. Patient was started on IV steroids, also started on empiric antibiotics with ceftriaxone and Zithromax and also nebulizer treatments and maintenance of her inhalers. Patient's clinical symptoms since admission have significantly improved. Patient already is on 2 liters nasal cannula oxygen at home. She was now back down to her home oxygen level and therefore is being discharged home on p.o. antibiotics for her pneumonia.  2. Chronic obstructive pulmonary disease exacerbation. This was likely secondary to the pneumococcal pneumonia. As mentioned patient was treated with IV steroids, around-the-clock nebulizer treatments, empiric ceftriaxone and Zithromax. Her blood cultures grew out strep pneumoniae. She will require two weeks of antibiotic therapy. She received four days of IV therapy here, will be discharged on p.o. 10 days of Ceftin as mentioned. As mentioned, she is already on home oxygen, which she will continue.  3. Pneumococcal pneumonia. Patient was treated with IV ceftriaxone in the hospital, is currently being discharged on p.o. Ceftin for the next 10 days. Her white cell count did go up to as high as 16,000 and did come down to 11,000 with treatment of antibiotics.  4. History of chronic atrial fibrillation. Patient remained rate controlled in the hospital. She will continue her digoxin and Toprol as stated for rate control. Her INR on the day of discharge was 3.3 therefore [name] did tell her to hold her Coumadin for the next two days and resume her Coumadin thereafter and get a PT-INR check when she sees her primary care physician within the next week to two  weeks.  5. Anxiety. Patient was maintained on her Xanax. She will resume that.  6. History of congestive heart failure. Clinically patient did not have any heart failure during the hospital course. She will resume her Lasix and beta blockers as stated.  7. CODE STATUS: Patient is a DO NOT INTUBATE, DO NOT RESUSCITATE.      TIME SPENT: 40 minutes.   ____________________________ Belia Heman. Verdell Carmine, MD vjs:cms D: 09/17/2011 15:23:54 ET T: 09/18/2011 11:14:03 ET JOB#: [id number]  cc: Belia Heman. Verdell Carmine, MD, <Dictator> Venia Carbon, MD Henreitta Leber MD ELECTRONICALLY SIGNED 09/19/2011 8:01

## 2014-06-02 NOTE — H&P (Signed)
PATIENT NAME:  [name] [name], [name] [name] MR#:  [id number] DATE OF BIRTH:  07-Nov-1920  DATE OF ADMISSION:  09/13/2011  PRIMARY CARE PHYSICIAN: Dr. Silvio Pate.   CHIEF COMPLAINT: Acute onset of shortness of breath.   HISTORY OF PRESENT ILLNESS: The patient is an 81 year old white female with chronic respiratory failure on 2.5 liters of oxygen at all times at home who has chronic dyspnea with minimal exertion who, according to the daughter, is chronically ill, started having acute onset of shortness of breath which started this afternoon. She started becoming very shaky and cold. She was brought to the ED, was noted to be very tachypnea and tachycardic, had to be placed on BiPAP. Also, noted to have a temperature of 100.4. The patient has not had any chest pain but has complained of left lower side/left upper abdomen pain which also started today. She has had some cough but has not been productive. She has not had any fevers at home that she could remember, but her temperature is elevated here. She does not have any lower extremity swelling. Does not complain of any calf pain. No chills. No abdominal pain. No nausea, vomiting, or diarrhea. Denies any urinary symptoms.   PAST MEDICAL HISTORY:  1. Hypertension.  2. Chronic atrial fibrillation.  3. Chronic obstructive pulmonary disease, on 2.5 liters of oxygen.  4. History of diastolic congestive heart failure.  5. History of pneumonia with SIRS in September 2012.  6. History of acute renal failure.  7. Anemia of chronic disease.   PAST SURGICAL HISTORY:  1. Status post spinal surgery of her lumbar spine.  2. Status post hysterectomy.  3. Appendectomy.  4. History of cataract surgery and surgery for detached retina.   ALLERGIES: She is allergic to ACE inhibitors, codeine, penicillin, prednisone and tramadol.   CURRENT MEDICATIONS AT HOME:  1. Advair 115/21 micrograms 1 puff b.[name].d.  2. Albuterol q.4 as needed.  3. Alprazolam 0.5, one tab p.o. b.[name].d. as needed.   4. Alprazolam 0.5, one tab p.o. at bedtime as needed for sleep. 5. Combivent as needed for wheezing, shortness of breath. 6. Digoxin 125 mcg daily.  7. Lasix 20 mg daily.  8. Metoprolol succinate 25 mg daily.  9. Multivitamin p.o. daily.  10. Nitroglycerin 0.4 p.r.n. chest pain.  11. Omega-3 fatty acid 1 tab p.o. b.[name].d.  12. Os-Cal plus vitamin D 1 tab p.o. b.[name].d. 250 mg.  13. KCl 10 mg extended release daily.  14. ProAir 2 puffs q.4 p.r.n.  15. Trazodone 50 at bedtime.  16. Vitamin B12 1000 mcg daily.  17. Warfarin 5 mg daily.   SOCIAL HISTORY: Remote history of smoking. Does not smoke anymore. No alcohol or drug use.   FAMILY HISTORY: Mother died at age 19 with old age.   REVIEW OF SYSTEMS: Complains of fatigue, and weakness. No pain. No weight loss. No weight gain. EYES: No blurred or double vision. No redness. Does have a history of cataracts  and detached retina. ENT: No tinnitus. No ear pain. No hearing loss. No difficulty swallowing. RESPIRATORY: Complains of nonproductive cough, also has intermittent wheezing. No hemoptysis. Has chronic obstructive pulmonary disease, chronic respiratory failure. Previous history of pneumonia. CARDIOVASCULAR: History of chest pain. Denies orthopnea. Has a history of atrial fibrillation. GASTROINTESTINAL: Denies any nausea, vomiting, or diarrhea. No abdominal pain. No hematemesis. No melena. Denies any rectal bleeding. GU: Denies any dysuria, hematuria, or renal calculus. ENDOCRINE: Denies any polyuria or nocturia, No increase in sweating. HEME/LYMPH: History of chronic anemia. No  easy bruisability or bleeding. SKIN: No acne. No rash. MUSCULOSKELETAL: No pain in the neck or back. NEUROLOGIC: No numbness. No cerebrovascular accident. No transient ischemic attack. PSYCHIATRIC: Has a history of anxiety related to her lung disease.   PHYSICAL EXAMINATION:  VITAL SIGNS: Temperature 100.4, pulse 77, respirations 38, blood pressure 188/62, O2 currently 100% on  BiPAP.   GENERAL: The patient is a very debilitated, critically ill-appearing female, currently having accessory muscle usage and is in obvious respiratory distress.   HEENT: Head atraumatic, normocephalic. Pupils are equal, round, reactive to light and accommodation. There is no conjunctival pallor. No scleral icterus. Nasal exam shows no drainage or ulceration. Oropharynx is clear without any exudates.   NECK: No thyromegaly. No carotid bruits.   CARDIOVASCULAR: Regular rate and rhythm. No murmurs, rubs, clicks, or gallops.   LUNGS: Diminished breath sounds bilaterally with limited air movement. No active wheezing. No rhonchi. No crackles. Positive accessory muscle usage.   ABDOMEN: Soft, nontender, nondistended. Positive bowel sounds x4. There is no hepatosplenomegaly.   EXTREMITIES: No clubbing, cyanosis, or edema.   SKIN: No rash.   LYMPHATICS: No lymph nodes palpable.   VASCULAR: Good DP, PT pulses.   PSYCHIATRIC: Currently a little anxious.   NEUROLOGICAL: Cranial nerves II through XII grossly intact. No focal deficits.   LABORATORY, DIAGNOSTIC AND RADIOLOGICAL DATA: Glucose 177, BUN 21, creatinine 1.28, sodium 140, potassium 4.2, chloride 99, CO2 32. Her LFTs are normal. Troponin less than 0.02. WBC 11.3, hemoglobin 13.8, platelet count 178, INR 1.3. Chest x-ray shows hyperinflation with the left lower lobe infiltrate.   ASSESSMENT AND PLAN: The patient is an 81 year old with chronic respiratory failure, on 2.5 liters of oxygen, who presents with onset of shortness of breath since this afternoon. The patient was very tachypnea on presentation.  1. Acute on chronic respiratory failure likely due to community-acquired pneumonia as well as chronic obstructive pulmonary disease flare. At this time, we will continue BiPAP as she is currently on. The patient wishes no intubation. Will also place her on nebulizers around-the-clock. IV Solu-Medrol as well as IV antibiotics with  ceftriaxone and azithromycin.  2. Acute on chronic obstructive pulmonary disease exacerbation. Will place her on nebulizers and steroids.  3. Likely community-acquired pneumonia. Will treat her with ceftriaxone and azithromycin. If does not improve, then will broaden the spectrum.  4. Chronic diastolic congestive heart failure. Will continue oral Lasix as taking at home.  5. Hypertension, currently elevated due to likely respiratory distress. Will continue metoprolol as taking at home.  6. History of atrial fibrillation. Continue metoprolol. Monitor on telemetry. Check a dig. level. Continue digoxin and metoprolol.  7. CODE STATUS: Discussed with the patient and she wants a DO NOT RESUSCITATE. The patient is critically ill and daughter and patient understand that.   CRITICAL CARE TIME SPENT: 50 minutes.    ____________________________ Lafonda Mosses Posey Pronto, MD shp:ap D: 09/13/2011 22:48:21 ET T: 09/14/2011 09:44:22 ET JOB#: [id number]  cc: Gwenna Fuston H. Posey Pronto, MD, <Dictator> Venia Carbon, MD Alric Seton MD ELECTRONICALLY SIGNED 09/21/2011 13:39
